# Patient Record
Sex: Female | Born: 1935 | Race: White | Hispanic: No | Smoking: Never smoker
Health system: Southern US, Community
[De-identification: ages and names within clinical notes are randomized; demographics above are authoritative.]

## PROBLEM LIST (undated history)

## (undated) DIAGNOSIS — I10 Essential (primary) hypertension: Secondary | ICD-10-CM

## (undated) DIAGNOSIS — K219 Gastro-esophageal reflux disease without esophagitis: Secondary | ICD-10-CM

## (undated) DIAGNOSIS — E079 Disorder of thyroid, unspecified: Secondary | ICD-10-CM

## (undated) DIAGNOSIS — M1990 Unspecified osteoarthritis, unspecified site: Secondary | ICD-10-CM

## (undated) DIAGNOSIS — Z8719 Personal history of other diseases of the digestive system: Secondary | ICD-10-CM

## (undated) HISTORY — PX: ABDOMINAL HYSTERECTOMY: SHX81

## (undated) HISTORY — PX: TONSILLECTOMY: SUR1361

## (undated) HISTORY — PX: APPENDECTOMY: SHX54

## (undated) HISTORY — PX: TUBAL LIGATION: SHX77

---

## 2004-09-26 ENCOUNTER — Ambulatory Visit: Payer: Self-pay | Admitting: Internal Medicine

## 2006-02-12 ENCOUNTER — Ambulatory Visit: Payer: Self-pay | Admitting: Internal Medicine

## 2007-02-11 ENCOUNTER — Ambulatory Visit: Payer: Self-pay | Admitting: Gastroenterology

## 2007-09-11 ENCOUNTER — Ambulatory Visit: Payer: Self-pay | Admitting: Family Medicine

## 2008-10-08 ENCOUNTER — Ambulatory Visit: Payer: Self-pay | Admitting: Family Medicine

## 2009-11-05 ENCOUNTER — Ambulatory Visit: Payer: Self-pay | Admitting: Family Medicine

## 2010-04-28 ENCOUNTER — Ambulatory Visit: Payer: Self-pay | Admitting: Gastroenterology

## 2010-04-29 LAB — PATHOLOGY REPORT

## 2010-11-09 ENCOUNTER — Ambulatory Visit: Payer: Self-pay | Admitting: Family Medicine

## 2012-01-31 ENCOUNTER — Ambulatory Visit: Payer: Self-pay | Admitting: Internal Medicine

## 2012-04-22 ENCOUNTER — Ambulatory Visit: Payer: Self-pay | Admitting: Gastroenterology

## 2013-02-05 ENCOUNTER — Ambulatory Visit: Payer: Self-pay | Admitting: Nurse Practitioner

## 2014-02-11 ENCOUNTER — Ambulatory Visit: Payer: Self-pay | Admitting: Nurse Practitioner

## 2015-03-15 ENCOUNTER — Encounter: Payer: Self-pay | Admitting: Emergency Medicine

## 2015-03-15 ENCOUNTER — Ambulatory Visit
Admission: EM | Admit: 2015-03-15 | Discharge: 2015-03-15 | Disposition: A | Discharge status: Home / Self Care | Payer: Medicare Other | Attending: Family Medicine | Admitting: Family Medicine

## 2015-03-15 DIAGNOSIS — L259 Unspecified contact dermatitis, unspecified cause: Secondary | ICD-10-CM | POA: Diagnosis not present

## 2015-03-15 HISTORY — DX: Disorder of thyroid, unspecified: E07.9

## 2015-03-15 MED ORDER — PREDNISONE 10 MG (21) PO TBPK: ORAL_TABLET | ORAL | Status: DC

## 2015-03-15 MED ORDER — RANITIDINE HCL 150 MG PO CAPS: 150.0000 mg | ORAL_CAPSULE | Freq: Two times a day (BID) | ORAL | Status: DC

## 2015-03-15 MED ORDER — LORATADINE 10 MG PO TABS: 10.0000 mg | ORAL_TABLET | Freq: Every day | ORAL | Status: DC

## 2015-03-15 NOTE — ED Provider Notes (Signed)
CSN: [id number]     Arrival date & time 03/15/15  1105 History   First MD Initiated Contact with Patient 03/15/15 1159     Chief Complaint  Patient presents with  . Rash   (Consider location/radiation/quality/duration/timing/severity/associated sxs/prior Treatment) Patient is a 81 y.o. female presenting with rash. The history is provided by the patient. No language interpreter was used.  Rash Location:  Head/neck and shoulder/arm Head/neck rash location:  L neck and R neck Shoulder/arm rash location:  L shoulder Quality: blistering and itchiness   Severity:  Moderate Onset quality:  Sudden Duration:  2 days Timing:  Constant Progression:  Spreading Chronicity:  New Context comment:  Unknown Relieved by:  Nothing Worsened by:  Nothing tried Ineffective treatments:  None tried Associated symptoms comment:  Rash   Past Medical History  Diagnosis Date  . Thyroid disease    Past Surgical History  Procedure Laterality Date  . Abdominal hysterectomy    . Tonsillectomy     History reviewed. No pertinent family history. Social History  Substance Use Topics  . Smoking status: Never Smoker   . Smokeless tobacco: None  . Alcohol Use: No   OB History    No data available     Review of Systems  Constitutional: Negative for activity change.  Skin: Positive for rash.  All other systems reviewed and are negative.   Allergies  Penicillins  Home Medications   Prior to Admission medications   Medication Sig Start Date End Date Taking? Authorizing Provider  enalapril (VASOTEC) 20 MG tablet Take 20 mg by mouth daily.   Yes Historical Provider, MD  levothyroxine (SYNTHROID, LEVOTHROID) 88 MCG tablet Take 88 mcg by mouth daily before breakfast.   Yes Historical Provider, MD  omeprazole (PRILOSEC) 20 MG capsule Take 20 mg by mouth daily.   Yes Historical Provider, MD  oxybutynin (DITROPAN) 5 MG tablet Take 5 mg by mouth 3 (three) times daily.   Yes Historical Provider, MD   loratadine (CLARITIN) 10 MG tablet Take 1 tablet (10 mg total) by mouth daily. Take 1 tablet in the morning. As needed for itching. 03/15/15   Hassan Rowan, MD  predniSONE (STERAPRED UNI-PAK 21 TAB) 10 MG (21) TBPK tablet 6 tabs day 1 and 2, 5 tabs day 3 and 4, 4 tabs day 5 and 6, 3 tabs day 7 and 8, 2 tabs day 9 and 10, 1 tab day 11 and 12. Take orally 03/15/15   Hassan Rowan, MD  ranitidine (ZANTAC) 150 MG capsule Take 1 capsule (150 mg total) by mouth 2 (two) times daily. As needed for itching 03/15/15   Hassan Rowan, MD   Meds Ordered and Administered this Visit  Medications - No data to display  BP 134/60 mmHg  Pulse 71  Temp(Src) 98.6 F (37 C) (Tympanic)  Resp 18  Ht  (1.6 m)  Wt 185 lb (83.915 kg)  BMI 32.78 kg/m2  SpO2 99% No data found.   Physical Exam  Constitutional: She is oriented to person, place, and time. She appears well-developed and well-nourished.  HENT:  Head: Normocephalic.  Eyes: Pupils are equal, round, and reactive to light.  Neck: Normal range of motion. Neck supple.  Musculoskeletal: Normal range of motion. She exhibits no edema or tenderness.  Neurological: She is alert and oriented to person, place, and time. No cranial nerve deficit.  Skin: Rash noted. There is erythema.  Psychiatric: She has a normal mood and affect. Her behavior is normal.  Vitals reviewed.  ED Course  Procedures (including critical care time)  Labs Review Labs Reviewed - No data to display  Imaging Review No results found.   Visual Acuity Review  Right Eye Distance:   Left Eye Distance:   Bilateral Distance:    Right Eye Near:   Left Eye Near:    Bilateral Near:         MDM   1. Contact dermatitis    Patient doesn't have a known contact but does have 2 cats ago outside and only She says that she touches is one that stays indoors. Rash got worse and appears to be contact dermatitis of unknown source of exposure. We'll place on prednisone for 12 days Zantac  and Claritin for itching.    Hassan Rowan, MD 03/15/15 [phone]

## 2015-03-15 NOTE — ED Notes (Signed)
Pt with a rash on face and chest x 3 days itching

## 2015-03-15 NOTE — Discharge Instructions (Signed)
Contact Dermatitis °Contact dermatitis is a rash that happens when something touches the skin. You touched something that irritates your skin, or you have allergies to something you touched. °HOME CARE  °· Avoid the thing that caused your rash. °· Keep your rash away from hot water, soap, sunlight, chemicals, and other things that might bother it. °· Do not scratch your rash. °· You can take cool baths to help stop itching. °· Only take medicine as told by your doctor. °· Keep all doctor visits as told. °GET HELP RIGHT AWAY IF:  °· Your rash is not better after 3 days. °· Your rash gets worse. °· Your rash is puffy (swollen), tender, red, sore, or warm. °· You have problems with your medicine. °MAKE SURE YOU:  °· Understand these instructions. °· Will watch your condition. °· Will get help right away if you are not doing well or get worse. °Document Released: 04/23/2009 Document Revised: 09/18/2011 Document Reviewed: 11/29/2010 °[employer]® Patient Information ©2015 [employer]. This information is not intended to replace advice given to you by your health care provider. Make sure you discuss any questions you have with your health care provider. ° °

## 2016-03-27 ENCOUNTER — Other Ambulatory Visit: Payer: Self-pay | Admitting: Nurse Practitioner

## 2016-03-27 DIAGNOSIS — Z1231 Encounter for screening mammogram for malignant neoplasm of breast: Secondary | ICD-10-CM

## 2016-03-28 ENCOUNTER — Ambulatory Visit: Payer: Medicare Other

## 2016-03-29 ENCOUNTER — Ambulatory Visit
Admission: RE | Admit: 2016-03-29 | Discharge: 2016-03-29 | Disposition: A | Discharge status: Home / Self Care | Payer: Medicare Other | Source: Ambulatory Visit | Attending: Nurse Practitioner | Admitting: Nurse Practitioner

## 2016-03-29 ENCOUNTER — Other Ambulatory Visit: Payer: Self-pay | Admitting: Nurse Practitioner

## 2016-03-29 DIAGNOSIS — Z1231 Encounter for screening mammogram for malignant neoplasm of breast: Secondary | ICD-10-CM

## 2017-07-13 ENCOUNTER — Ambulatory Visit
Admission: RE | Admit: 2017-07-13 | Discharge: 2017-07-13 | Disposition: A | Discharge status: Home / Self Care | Payer: Medicare Other | Source: Ambulatory Visit | Attending: Nurse Practitioner | Admitting: Nurse Practitioner

## 2017-07-13 ENCOUNTER — Other Ambulatory Visit: Payer: Self-pay | Admitting: Nurse Practitioner

## 2017-07-13 DIAGNOSIS — S32000A Wedge compression fracture of unspecified lumbar vertebra, initial encounter for closed fracture: Secondary | ICD-10-CM

## 2017-07-13 DIAGNOSIS — K8020 Calculus of gallbladder without cholecystitis without obstruction: Secondary | ICD-10-CM | POA: Insufficient documentation

## 2017-07-13 DIAGNOSIS — M48061 Spinal stenosis, lumbar region without neurogenic claudication: Secondary | ICD-10-CM | POA: Diagnosis not present

## 2017-07-13 DIAGNOSIS — K5730 Diverticulosis of large intestine without perforation or abscess without bleeding: Secondary | ICD-10-CM | POA: Insufficient documentation

## 2017-07-13 DIAGNOSIS — X58XXXA Exposure to other specified factors, initial encounter: Secondary | ICD-10-CM | POA: Insufficient documentation

## 2017-07-13 DIAGNOSIS — M4804 Spinal stenosis, thoracic region: Secondary | ICD-10-CM | POA: Diagnosis not present

## 2017-07-13 DIAGNOSIS — M5136 Other intervertebral disc degeneration, lumbar region: Secondary | ICD-10-CM | POA: Diagnosis not present

## 2017-07-13 DIAGNOSIS — S22080A Wedge compression fracture of T11-T12 vertebra, initial encounter for closed fracture: Secondary | ICD-10-CM | POA: Diagnosis not present

## 2017-07-16 ENCOUNTER — Other Ambulatory Visit: Payer: Self-pay | Admitting: Nurse Practitioner

## 2017-07-25 ENCOUNTER — Encounter
Admission: RE | Admit: 2017-07-25 | Discharge: 2017-07-25 | Disposition: A | Discharge status: Home / Self Care | Payer: Medicare Other | Source: Ambulatory Visit | Attending: Orthopedic Surgery | Admitting: Orthopedic Surgery

## 2017-07-25 ENCOUNTER — Other Ambulatory Visit: Payer: Self-pay

## 2017-07-25 DIAGNOSIS — E039 Hypothyroidism, unspecified: Secondary | ICD-10-CM | POA: Diagnosis not present

## 2017-07-25 DIAGNOSIS — K219 Gastro-esophageal reflux disease without esophagitis: Secondary | ICD-10-CM | POA: Diagnosis not present

## 2017-07-25 DIAGNOSIS — N183 Chronic kidney disease, stage 3 (moderate): Secondary | ICD-10-CM | POA: Diagnosis not present

## 2017-07-25 DIAGNOSIS — Z888 Allergy status to other drugs, medicaments and biological substances status: Secondary | ICD-10-CM | POA: Diagnosis not present

## 2017-07-25 DIAGNOSIS — M9963 Osseous and subluxation stenosis of intervertebral foramina of lumbar region: Secondary | ICD-10-CM | POA: Diagnosis not present

## 2017-07-25 DIAGNOSIS — I10 Essential (primary) hypertension: Secondary | ICD-10-CM

## 2017-07-25 DIAGNOSIS — Z79899 Other long term (current) drug therapy: Secondary | ICD-10-CM | POA: Diagnosis not present

## 2017-07-25 DIAGNOSIS — Z7982 Long term (current) use of aspirin: Secondary | ICD-10-CM | POA: Diagnosis not present

## 2017-07-25 DIAGNOSIS — Z880 Allergy status to penicillin: Secondary | ICD-10-CM | POA: Diagnosis not present

## 2017-07-25 DIAGNOSIS — M810 Age-related osteoporosis without current pathological fracture: Secondary | ICD-10-CM | POA: Diagnosis not present

## 2017-07-25 DIAGNOSIS — M8448XA Pathological fracture, other site, initial encounter for fracture: Secondary | ICD-10-CM | POA: Diagnosis not present

## 2017-07-25 DIAGNOSIS — Z86010 Personal history of colonic polyps: Secondary | ICD-10-CM | POA: Diagnosis not present

## 2017-07-25 DIAGNOSIS — I129 Hypertensive chronic kidney disease with stage 1 through stage 4 chronic kidney disease, or unspecified chronic kidney disease: Secondary | ICD-10-CM | POA: Diagnosis not present

## 2017-07-25 DIAGNOSIS — E785 Hyperlipidemia, unspecified: Secondary | ICD-10-CM | POA: Diagnosis not present

## 2017-07-25 DIAGNOSIS — M1990 Unspecified osteoarthritis, unspecified site: Secondary | ICD-10-CM | POA: Diagnosis not present

## 2017-07-25 HISTORY — DX: Unspecified osteoarthritis, unspecified site: M19.90

## 2017-07-25 HISTORY — DX: Essential (primary) hypertension: I10

## 2017-07-25 NOTE — Patient Instructions (Signed)
Your procedure is scheduled on: July 26, 2017 AT 10:00 AM Report to Same Day Surgery on the 2nd floor in the [facility].   REMEMBER: Instructions that are not followed completely may result in serious medical risk, up to and including death; or upon the discretion of your surgeon and anesthesiologist your surgery may need to be rescheduled.  Do not eat food after midnight the night before your procedure.  No gum chewing or hard candies.  You may however, drink CLEAR liquids up to 2 hours before you are scheduled to arrive at the hospital for your procedure.  Do not drink clear liquids within 2 hours of the start of your surgery.  Clear liquids include: - water  - apple juice without pulp - clear gatorade - black coffee or tea (Do NOT add anything to the coffee or tea) Do NOT drink anything that is not on this list.  No Alcohol for 24 hours before or after surgery.  No Smoking including e-cigarettes for 24 hours prior to surgery. No chewable tobacco products for at least 6 hours prior to surgery. No nicotine patches on the day of surgery.  Notify your doctor if there is any change in your medical condition (cold, fever, infection).  Do not wear jewelry, make-up, hairpins, clips or nail polish.  Do not wear lotions, powders, or perfumes. You may NOT wear deodorant.  Do not shave 48 hours prior to surgery. Men may shave face and neck.  Contacts and dentures may not be worn into surgery.  Do not bring valuables to the hospital. [facility] is not responsible for any belongings or valuables.   TAKE THESE MEDICATIONS THE MORNING OF SURGERY WITH A SIP OF WATER: PAIN PILL IF NEEDED  LEVOTHYROXINE OMEPRAZOLE - TAKE A DOSE NIGHT BEFORE SURGERY AND THE MORNING OF SURGERY OXYBUTYNIN TYLENOL IF NEEDED  Use CHG Soap or wipes as directed on instruction sheet.   Follow recommendations from Cardiologist, Pulmonologist or PCP regarding stopping Aspirin, Coumadin, Plavix, Eliquis,  Pradaxa, or Pletal.  Stop Anti-inflammatories such as Advil, Aleve, Ibuprofen, Motrin, Naproxen, Naprosyn, Goodie powder, or aspirin products. (May take Tylenol or Acetaminophen if needed.)  Stop ANY OVER THE COUNTER supplements until after surgery. (May continue Vitamin D, Vitamin B, and multivitamin.)   If you are being discharged the day of surgery, you will not be allowed to drive home. You will need someone to drive you home and stay with you that night.   If you are taking public transportation, you will need to have a responsible adult to with you.  Please call the number above if you have any questions about these instructions.

## 2017-07-26 ENCOUNTER — Encounter
Admission: RE | Disposition: A | Discharge status: Home / Self Care | Payer: Self-pay | Source: Ambulatory Visit | Attending: Orthopedic Surgery

## 2017-07-26 ENCOUNTER — Ambulatory Visit: Payer: Medicare Other | Admitting: Anesthesiology

## 2017-07-26 ENCOUNTER — Ambulatory Visit
Admission: RE | Admit: 2017-07-26 | Discharge: 2017-07-26 | Disposition: A | Discharge status: Home / Self Care | Payer: Medicare Other | Source: Ambulatory Visit | Attending: Orthopedic Surgery | Admitting: Orthopedic Surgery

## 2017-07-26 ENCOUNTER — Ambulatory Visit: Payer: Medicare Other

## 2017-07-26 DIAGNOSIS — E039 Hypothyroidism, unspecified: Secondary | ICD-10-CM | POA: Insufficient documentation

## 2017-07-26 DIAGNOSIS — I129 Hypertensive chronic kidney disease with stage 1 through stage 4 chronic kidney disease, or unspecified chronic kidney disease: Secondary | ICD-10-CM | POA: Insufficient documentation

## 2017-07-26 DIAGNOSIS — N183 Chronic kidney disease, stage 3 (moderate): Secondary | ICD-10-CM | POA: Insufficient documentation

## 2017-07-26 DIAGNOSIS — E785 Hyperlipidemia, unspecified: Secondary | ICD-10-CM | POA: Insufficient documentation

## 2017-07-26 DIAGNOSIS — M9963 Osseous and subluxation stenosis of intervertebral foramina of lumbar region: Secondary | ICD-10-CM | POA: Insufficient documentation

## 2017-07-26 DIAGNOSIS — Z7982 Long term (current) use of aspirin: Secondary | ICD-10-CM | POA: Insufficient documentation

## 2017-07-26 DIAGNOSIS — Z86010 Personal history of colonic polyps: Secondary | ICD-10-CM | POA: Insufficient documentation

## 2017-07-26 DIAGNOSIS — Z79899 Other long term (current) drug therapy: Secondary | ICD-10-CM | POA: Insufficient documentation

## 2017-07-26 DIAGNOSIS — Z888 Allergy status to other drugs, medicaments and biological substances status: Secondary | ICD-10-CM | POA: Insufficient documentation

## 2017-07-26 DIAGNOSIS — M810 Age-related osteoporosis without current pathological fracture: Secondary | ICD-10-CM | POA: Insufficient documentation

## 2017-07-26 DIAGNOSIS — Z880 Allergy status to penicillin: Secondary | ICD-10-CM | POA: Insufficient documentation

## 2017-07-26 DIAGNOSIS — Z419 Encounter for procedure for purposes other than remedying health state, unspecified: Secondary | ICD-10-CM

## 2017-07-26 DIAGNOSIS — M8448XA Pathological fracture, other site, initial encounter for fracture: Secondary | ICD-10-CM | POA: Insufficient documentation

## 2017-07-26 DIAGNOSIS — K219 Gastro-esophageal reflux disease without esophagitis: Secondary | ICD-10-CM | POA: Insufficient documentation

## 2017-07-26 DIAGNOSIS — M1990 Unspecified osteoarthritis, unspecified site: Secondary | ICD-10-CM | POA: Insufficient documentation

## 2017-07-26 HISTORY — PX: VERTEBROPLASTY: SHX113

## 2017-07-26 SURGERY — VERTEBROPLASTY
Anesthesia: General | Wound class: Clean

## 2017-07-26 MED ORDER — PROPOFOL 10 MG/ML IV BOLUS
INTRAVENOUS | Status: AC
  Filled 2017-07-26: qty 20

## 2017-07-26 MED ORDER — LACTATED RINGERS IV SOLN
INTRAVENOUS | Status: DC
  Administered 2017-07-26: 10:00:00 via INTRAVENOUS

## 2017-07-26 MED ORDER — LIDOCAINE HCL (PF) 1 % IJ SOLN
INTRAMUSCULAR | Status: DC | PRN
  Administered 2017-07-26: 20 mL

## 2017-07-26 MED ORDER — CLINDAMYCIN PHOSPHATE 900 MG/50ML IV SOLN
900.0000 mg | Freq: Once | INTRAVENOUS | Status: AC
  Administered 2017-07-26: 900 mg via INTRAVENOUS

## 2017-07-26 MED ORDER — BUPIVACAINE-EPINEPHRINE (PF) 0.5% -1:200000 IJ SOLN
INTRAMUSCULAR | Status: DC | PRN
  Administered 2017-07-26: 20 mL

## 2017-07-26 MED ORDER — BUPIVACAINE-EPINEPHRINE (PF) 0.5% -1:200000 IJ SOLN
INTRAMUSCULAR | Status: AC
  Filled 2017-07-26: qty 30

## 2017-07-26 MED ORDER — FENTANYL CITRATE (PF) 100 MCG/2ML IJ SOLN
25.0000 ug | INTRAMUSCULAR | Status: DC | PRN
Start: 2017-07-26 — End: 2017-07-26

## 2017-07-26 MED ORDER — PROPOFOL 500 MG/50ML IV EMUL
INTRAVENOUS | Status: DC | PRN
  Administered 2017-07-26: 25 ug/kg/min via INTRAVENOUS

## 2017-07-26 MED ORDER — CLINDAMYCIN PHOSPHATE 900 MG/50ML IV SOLN
INTRAVENOUS | Status: AC
  Filled 2017-07-26: qty 50

## 2017-07-26 MED ORDER — LIDOCAINE HCL (PF) 1 % IJ SOLN
INTRAMUSCULAR | Status: AC
  Filled 2017-07-26: qty 30

## 2017-07-26 MED ORDER — PROPOFOL 10 MG/ML IV BOLUS
INTRAVENOUS | Status: DC | PRN
  Administered 2017-07-26 (×2): 20 mg via INTRAVENOUS

## 2017-07-26 MED ORDER — HYDROCODONE-ACETAMINOPHEN 5-325 MG PO TABS: 1.0000 | ORAL_TABLET | Freq: Four times a day (QID) | ORAL | 0 refills | Status: DC | PRN

## 2017-07-26 MED ORDER — ONDANSETRON HCL 4 MG/2ML IJ SOLN: 4.0000 mg | Freq: Once | INTRAMUSCULAR | Status: DC | PRN

## 2017-07-26 MED ORDER — KETAMINE HCL 50 MG/ML IJ SOLN
INTRAMUSCULAR | Status: DC | PRN
  Administered 2017-07-26: 10 mg via INTRAVENOUS
  Administered 2017-07-26: 15 mg via INTRAVENOUS

## 2017-07-26 MED ORDER — HYDROCODONE-ACETAMINOPHEN 5-325 MG PO TABS
ORAL_TABLET | ORAL | Status: AC
  Filled 2017-07-26: qty 1

## 2017-07-26 MED ORDER — HYDROCODONE-ACETAMINOPHEN 5-325 MG PO TABS
1.0000 | ORAL_TABLET | Freq: Four times a day (QID) | ORAL | Status: DC | PRN
  Administered 2017-07-26: 1 via ORAL

## 2017-07-26 MED ORDER — LIDOCAINE HCL (PF) 2 % IJ SOLN
INTRAMUSCULAR | Status: AC
  Filled 2017-07-26: qty 10

## 2017-07-26 MED ORDER — LIDOCAINE 2% (20 MG/ML) 5 ML SYRINGE
INTRAMUSCULAR | Status: DC | PRN
  Administered 2017-07-26: 50 mg via INTRAVENOUS

## 2017-07-26 SURGICAL SUPPLY — 20 items
ADH SKN CLS APL DERMABOND .7 (GAUZE/BANDAGES/DRESSINGS) ×1
BIT DRILL KYPHON EXPRESS (MISCELLANEOUS) ×2 IMPLANT
CEMENT BONE KYPHON CDS (Cement) ×3 IMPLANT
CEMENT KYPHON CX01A KIT/MIXER (Cement) ×3 IMPLANT
DERMABOND ADVANCED (GAUZE/BANDAGES/DRESSINGS) ×2
DERMABOND ADVANCED .7 DNX12 (GAUZE/BANDAGES/DRESSINGS) ×1 IMPLANT
DEVICE BIOPSY BONE KYPH (INSTRUMENTS) ×18 IMPLANT
DRAPE C-ARM XRAY 36X54 (DRAPES) ×3 IMPLANT
DRILL KYPHON EXPRESS (MISCELLANEOUS) ×6
DURAPREP 26ML APPLICATOR (WOUND CARE) ×3 IMPLANT
GLOVE BIOGEL PI IND STRL 7.5 (GLOVE) ×2 IMPLANT
GLOVE BIOGEL PI INDICATOR 7.5 (GLOVE) ×4
GLOVE SURG SYN 9.0  PF PI (GLOVE) ×2
GLOVE SURG SYN 9.0 PF PI (GLOVE) ×1 IMPLANT
GOWN SRG 2XL LVL 4 RGLN SLV (GOWNS) ×1 IMPLANT
GOWN STRL NON-REIN 2XL LVL4 (GOWNS) ×3
GOWN STRL REUS W/ TWL LRG LVL3 (GOWN DISPOSABLE) ×2 IMPLANT
GOWN STRL REUS W/TWL LRG LVL3 (GOWN DISPOSABLE) ×6
KIT RM TURNOVER STRD PROC AR (KITS) ×3 IMPLANT
PACK KYPHOPLASTY (MISCELLANEOUS) ×3 IMPLANT

## 2017-07-26 NOTE — Anesthesia Postprocedure Evaluation (Signed)
Anesthesia Post Note  Patient: Traci Lee  Procedure(s) Performed: VERTEBROPLASTY/ SACROPLASTY S1 (N/A )  Patient location during evaluation: PACU Anesthesia Type: General Level of consciousness: awake and alert and oriented Pain management: pain level controlled Vital Signs Assessment: post-procedure vital signs reviewed and stable Respiratory status: spontaneous breathing Cardiovascular status: blood pressure returned to baseline Anesthetic complications: no     Last Vitals:  Vitals:   07/26/17 1416 07/26/17 1443  BP: (!) 147/52 (!) 122/50  Pulse: 94 92  Resp: 18   Temp: (!) 36.2 C   SpO2: 100% 99%    Last Pain:  Vitals:   07/26/17 1416  TempSrc: Temporal  PainSc: 0-No pain                 Holt Woolbright

## 2017-07-26 NOTE — Op Note (Signed)
07/26/2017  1:32 PM  PATIENT:  Traci Lee  84 y.o. female  PRE-OPERATIVE DIAGNOSIS:  Sacral insufficiency fracture  POST-OPERATIVE DIAGNOSIS: Sacral insufficiency fracture  PROCEDURE:  Procedure(s): VERTEBROPLASTY/ SACROPLASTY S1 (N/A)  SURGEON: Laurene Footman, MD  ASSISTANTS: None  ANESTHESIA:   local and MAC  EBL:  No intake/output data recorded.  BLOOD ADMINISTERED:none  DRAINS: none   LOCAL MEDICATIONS USED:  MARCAINE    and XYLOCAINE   SPECIMEN:  No Specimen  DISPOSITION OF SPECIMEN:  N/A  COUNTS:  YES  TOURNIQUET:  * No tourniquets in log *  IMPLANTS: Bone cement  DICTATION: .Dragon Dictation patient brought the operating room and after adequate sedation was given the patient was placed prone and the C-arm was brought in and good visualization obtained of the sacrum. After appropriate patient identification timeout procedure local asthenic was infiltrated subcutaneously. Back was then prepped and draped in sterile fashion and repeat timeout procedure carried out. Spinal needle was used to get local down to the S1 pedicle on both sides. A small incision was made and trochars advanced just lateral to the S1 pedicle on each side getting into the sacrum but staying out of the SI joint. Care was taken to get x-rays on both AP and lateral images to be certain of placement trocar. Biopsy was attempted but not obtained. 3 cc of bone cement was injected into these areas and after the cement had set trochars removed and permanent C-arm views obtained Dermabond was used to close the skin followed by a Band-Aid  PLAN OF CARE: Discharge to home after PACU  PATIENT DISPOSITION:  PACU - hemodynamically stable.

## 2017-07-26 NOTE — Discharge Instructions (Addendum)
Pain medicine as directed. Take it easy today and tomorrow and resume more normal activities on Saturday. Remove Band-Aid on Saturday and then okay to shower.  AMBULATORY SURGERY  DISCHARGE INSTRUCTIONS   1) The drugs that you were given will stay in your system until tomorrow so for the next 24 hours you should not:  A) Drive an automobile B) Make any legal decisions C) Drink any alcoholic beverage   2) You may resume regular meals tomorrow.  Today it is better to start with liquids and gradually work up to solid foods.  You may eat anything you prefer, but it is better to start with liquids, then soup and crackers, and gradually work up to solid foods.   3) Please notify your doctor immediately if you have any unusual bleeding, trouble breathing, redness and pain at the surgery site, drainage, fever, or pain not relieved by medication.    4) Additional Instructions:        Please contact your physician with any problems or Same Day Surgery at [phone], Monday through Friday 6 am to 4 pm, or [place] at [facility] Main number at [phone].

## 2017-07-26 NOTE — H&P (Signed)
Reviewed paper H+P, will be scanned into chart. No changes noted. Patient was seen in neurosurgery in our office after I initially saw her for her T12 fracture and they noted her sacral insufficiency fracture that radiology head-tilt to appreciate. Because of her pain being in this lower region and this fit her symptoms we have elected to proceed with sacral plasty

## 2017-07-26 NOTE — Anesthesia Post-op Follow-up Note (Signed)
Anesthesia QCDR form completed.        

## 2017-07-26 NOTE — Transfer of Care (Signed)
Immediate Anesthesia Transfer of Care Note  Patient: Traci Lee  Procedure(s) Performed: VERTEBROPLASTY/ SACROPLASTY S1 (N/A )  Patient Location: PACU  Anesthesia Type:General  Level of Consciousness: awake, alert  and oriented  Airway & Oxygen Therapy: Patient Spontanous Breathing  Post-op Assessment: Report given to RN and Post -op Vital signs reviewed and stable  Post vital signs: Reviewed  Last Vitals:  Vitals:   07/26/17 1000 07/26/17 1335  BP: 120/72 115/65  Pulse: 87 92  Resp: 18 17  Temp: (!) 35.9 C   SpO2: 94% 98%    Last Pain:  Vitals:   07/26/17 1000  PainSc: 5          Complications: No apparent anesthesia complications

## 2017-07-26 NOTE — Anesthesia Preprocedure Evaluation (Signed)
Anesthesia Evaluation  Patient identified by MRN, date of birth, ID band Patient awake    Reviewed: Allergy & Precautions, NPO status , Patient's Chart, lab work & pertinent test results  Airway Mallampati: II  TM Distance: <3 FB     Dental   Pulmonary neg pulmonary ROS,    Pulmonary exam normal        Cardiovascular hypertension, Pt. on medications Normal cardiovascular exam     Neuro/Psych negative neurological ROS  negative psych ROS   GI/Hepatic Neg liver ROS, GERD  Medicated and Controlled,  Endo/Other  Hypothyroidism   Renal/GU negative Renal ROS  negative genitourinary   Musculoskeletal  (+) Arthritis , Osteoarthritis,    Abdominal Normal abdominal exam  (+)   Peds negative pediatric ROS (+)  Hematology negative hematology ROS (+)   Anesthesia Other Findings   Reproductive/Obstetrics                             Anesthesia Physical Anesthesia Plan  ASA: III  Anesthesia Plan: General and MAC   Post-op Pain Management:    Induction: Intravenous  PONV Risk Score and Plan: TIVA  Airway Management Planned: Nasal Cannula  Additional Equipment:   Intra-op Plan:   Post-operative Plan:   Informed Consent: I have reviewed the patients History and Physical, chart, labs and discussed the procedure including the risks, benefits and alternatives for the proposed anesthesia with the patient or authorized representative who has indicated his/her understanding and acceptance.   Dental advisory given  Plan Discussed with: CRNA and Surgeon  Anesthesia Plan Comments:         Anesthesia Quick Evaluation

## 2017-08-14 ENCOUNTER — Emergency Department: Payer: Medicare Other

## 2017-08-14 ENCOUNTER — Other Ambulatory Visit: Payer: Self-pay

## 2017-08-14 ENCOUNTER — Inpatient Hospital Stay: Payer: Medicare Other | Admitting: Anesthesiology

## 2017-08-14 ENCOUNTER — Encounter
Admission: EM | Disposition: A | Discharge status: Home Health | Payer: Self-pay | Source: Home / Self Care | Attending: Internal Medicine

## 2017-08-14 ENCOUNTER — Encounter: Payer: Self-pay | Admitting: Emergency Medicine

## 2017-08-14 ENCOUNTER — Inpatient Hospital Stay
Admission: EM | Admit: 2017-08-14 | Discharge: 2017-08-15 | DRG: 517 | Disposition: A | Discharge status: Home Health | Payer: Medicare Other | Attending: Internal Medicine | Admitting: Internal Medicine

## 2017-08-14 ENCOUNTER — Inpatient Hospital Stay: Payer: Medicare Other

## 2017-08-14 DIAGNOSIS — I10 Essential (primary) hypertension: Secondary | ICD-10-CM | POA: Diagnosis not present

## 2017-08-14 DIAGNOSIS — Z419 Encounter for procedure for purposes other than remedying health state, unspecified: Secondary | ICD-10-CM

## 2017-08-14 DIAGNOSIS — Z79899 Other long term (current) drug therapy: Secondary | ICD-10-CM

## 2017-08-14 DIAGNOSIS — Z880 Allergy status to penicillin: Secondary | ICD-10-CM

## 2017-08-14 DIAGNOSIS — Z7982 Long term (current) use of aspirin: Secondary | ICD-10-CM

## 2017-08-14 DIAGNOSIS — M545 Low back pain, unspecified: Secondary | ICD-10-CM | POA: Diagnosis present

## 2017-08-14 DIAGNOSIS — M4854XA Collapsed vertebra, not elsewhere classified, thoracic region, initial encounter for fracture: Principal | ICD-10-CM | POA: Diagnosis present

## 2017-08-14 DIAGNOSIS — Z888 Allergy status to other drugs, medicaments and biological substances status: Secondary | ICD-10-CM | POA: Diagnosis not present

## 2017-08-14 DIAGNOSIS — S22000A Wedge compression fracture of unspecified thoracic vertebra, initial encounter for closed fracture: Secondary | ICD-10-CM

## 2017-08-14 HISTORY — PX: KYPHOPLASTY: SHX5884

## 2017-08-14 LAB — COMPREHENSIVE METABOLIC PANEL
ALBUMIN: 3.8 g/dL (ref 3.5–5.0)
ALK PHOS: 81 U/L (ref 38–126)
ALT: 16 U/L (ref 14–54)
ANION GAP: 13 (ref 5–15)
AST: 30 U/L (ref 15–41)
BILIRUBIN TOTAL: 0.8 mg/dL (ref 0.3–1.2)
BUN: 18 mg/dL (ref 6–20)
CALCIUM: 9.3 mg/dL (ref 8.9–10.3)
CO2: 23 mmol/L (ref 22–32)
Chloride: 97 mmol/L — ABNORMAL LOW (ref 101–111)
Creatinine, Ser: 0.99 mg/dL (ref 0.44–1.00)
GFR calc non Af Amer: 52 mL/min — ABNORMAL LOW (ref >60)
GLUCOSE: 130 mg/dL — AB (ref 65–99)
POTASSIUM: 4.4 mmol/L (ref 3.5–5.1)
SODIUM: 133 mmol/L — AB (ref 135–145)
TOTAL PROTEIN: 7 g/dL (ref 6.5–8.1)

## 2017-08-14 LAB — CBC
HEMATOCRIT: 43.8 % (ref 35.0–47.0)
Hemoglobin: 15 g/dL (ref 12.0–16.0)
MCH: 31.3 pg (ref 26.0–34.0)
MCHC: 34.2 g/dL (ref 32.0–36.0)
MCV: 91.6 fL (ref 80.0–100.0)
Platelets: 419 10*3/uL (ref 150–440)
RBC: 4.78 MIL/uL (ref 3.80–5.20)
RDW: 15.2 % — AB (ref 11.5–14.5)
WBC: 6.2 10*3/uL (ref 3.6–11.0)

## 2017-08-14 SURGERY — KYPHOPLASTY
Anesthesia: Monitor Anesthesia Care

## 2017-08-14 MED ORDER — KETAMINE HCL 50 MG/ML IJ SOLN
INTRAMUSCULAR | Status: DC | PRN
  Administered 2017-08-14 (×2): 25 mg via INTRAMUSCULAR

## 2017-08-14 MED ORDER — HYDRALAZINE HCL 20 MG/ML IJ SOLN: 10.0000 mg | Freq: Four times a day (QID) | INTRAMUSCULAR | Status: DC | PRN

## 2017-08-14 MED ORDER — PROPOFOL 10 MG/ML IV BOLUS
INTRAVENOUS | Status: AC
  Filled 2017-08-14: qty 20

## 2017-08-14 MED ORDER — OXYBUTYNIN CHLORIDE 5 MG PO TABS
5.0000 mg | ORAL_TABLET | Freq: Two times a day (BID) | ORAL | Status: DC
  Administered 2017-08-14 – 2017-08-15 (×2): 5 mg via ORAL
  Filled 2017-08-14 (×3): qty 1

## 2017-08-14 MED ORDER — PANTOPRAZOLE SODIUM 40 MG PO TBEC
40.0000 mg | DELAYED_RELEASE_TABLET | Freq: Every day | ORAL | Status: DC
  Administered 2017-08-14 – 2017-08-15 (×2): 40 mg via ORAL
  Filled 2017-08-14 (×2): qty 1

## 2017-08-14 MED ORDER — ALBUTEROL SULFATE (2.5 MG/3ML) 0.083% IN NEBU: 2.5000 mg | INHALATION_SOLUTION | RESPIRATORY_TRACT | Status: DC | PRN

## 2017-08-14 MED ORDER — LIDOCAINE HCL (PF) 1 % IJ SOLN
INTRAMUSCULAR | Status: AC
  Filled 2017-08-14: qty 30

## 2017-08-14 MED ORDER — LIDOCAINE HCL 1 % IJ SOLN
INTRAMUSCULAR | Status: DC | PRN
  Administered 2017-08-14: 20 mL

## 2017-08-14 MED ORDER — IOPAMIDOL (ISOVUE-M 200) INJECTION 41%
INTRAMUSCULAR | Status: DC | PRN
Start: 2017-08-14 — End: 2017-08-14
  Administered 2017-08-14: 20 mL

## 2017-08-14 MED ORDER — FENTANYL CITRATE (PF) 100 MCG/2ML IJ SOLN
25.0000 ug | Freq: Once | INTRAMUSCULAR | Status: AC
  Administered 2017-08-14: 25 ug via INTRAVENOUS

## 2017-08-14 MED ORDER — BUPIVACAINE-EPINEPHRINE (PF) 0.5% -1:200000 IJ SOLN
INTRAMUSCULAR | Status: AC
Start: 2017-08-14 — End: ?
  Filled 2017-08-14: qty 30

## 2017-08-14 MED ORDER — POLYETHYLENE GLYCOL 3350 17 G PO PACK: 17.0000 g | PACK | Freq: Every day | ORAL | Status: DC | PRN

## 2017-08-14 MED ORDER — ONDANSETRON HCL 4 MG/2ML IJ SOLN: 4.0000 mg | Freq: Once | INTRAMUSCULAR | Status: DC | PRN

## 2017-08-14 MED ORDER — ACETAMINOPHEN 650 MG RE SUPP: 650.0000 mg | Freq: Four times a day (QID) | RECTAL | Status: DC | PRN

## 2017-08-14 MED ORDER — SODIUM CHLORIDE 0.9 % IV SOLN: INTRAVENOUS | Status: DC

## 2017-08-14 MED ORDER — FENTANYL CITRATE (PF) 100 MCG/2ML IJ SOLN
25.0000 ug | Freq: Once | INTRAMUSCULAR | Status: AC
  Administered 2017-08-14: 25 ug via INTRAVENOUS
  Filled 2017-08-14: qty 2

## 2017-08-14 MED ORDER — ONDANSETRON HCL 4 MG PO TABS: 4.0000 mg | ORAL_TABLET | Freq: Four times a day (QID) | ORAL | Status: DC | PRN

## 2017-08-14 MED ORDER — PROPOFOL 10 MG/ML IV BOLUS
INTRAVENOUS | Status: DC | PRN
  Administered 2017-08-14 (×3): 20 mg via INTRAVENOUS

## 2017-08-14 MED ORDER — ENALAPRIL MALEATE 10 MG PO TABS
20.0000 mg | ORAL_TABLET | Freq: Every day | ORAL | Status: DC
  Administered 2017-08-15: 20 mg via ORAL
  Filled 2017-08-14 (×2): qty 2

## 2017-08-14 MED ORDER — ONDANSETRON HCL 4 MG/2ML IJ SOLN: 4.0000 mg | Freq: Four times a day (QID) | INTRAMUSCULAR | Status: DC | PRN

## 2017-08-14 MED ORDER — MORPHINE SULFATE (PF) 4 MG/ML IV SOLN
INTRAVENOUS | Status: AC
  Administered 2017-08-14: 4 mg via INTRAVENOUS
  Filled 2017-08-14: qty 1

## 2017-08-14 MED ORDER — MORPHINE SULFATE (PF) 4 MG/ML IV SOLN: 4.0000 mg | INTRAVENOUS | Status: DC | PRN

## 2017-08-14 MED ORDER — SODIUM CHLORIDE 0.9 % IV SOLN
INTRAVENOUS | Status: DC
  Administered 2017-08-14 – 2017-08-15 (×2): via INTRAVENOUS

## 2017-08-14 MED ORDER — LEVOTHYROXINE SODIUM 88 MCG PO TABS
88.0000 ug | ORAL_TABLET | Freq: Every day | ORAL | Status: DC
  Administered 2017-08-15: 88 ug via ORAL
  Filled 2017-08-14 (×2): qty 1

## 2017-08-14 MED ORDER — MORPHINE SULFATE (PF) 4 MG/ML IV SOLN
4.0000 mg | Freq: Once | INTRAVENOUS | Status: AC
  Administered 2017-08-14: 4 mg via INTRAVENOUS

## 2017-08-14 MED ORDER — FENTANYL CITRATE (PF) 100 MCG/2ML IJ SOLN: 25.0000 ug | INTRAMUSCULAR | Status: DC | PRN

## 2017-08-14 MED ORDER — SODIUM CHLORIDE FLUSH 0.9 % IV SOLN
INTRAVENOUS | Status: AC
  Filled 2017-08-14: qty 10

## 2017-08-14 MED ORDER — KETAMINE HCL 50 MG/ML IJ SOLN
INTRAMUSCULAR | Status: AC
  Filled 2017-08-14: qty 10

## 2017-08-14 MED ORDER — ENOXAPARIN SODIUM 40 MG/0.4ML ~~LOC~~ SOLN
40.0000 mg | SUBCUTANEOUS | Status: DC
  Administered 2017-08-15: 40 mg via SUBCUTANEOUS
  Filled 2017-08-14: qty 0.4

## 2017-08-14 MED ORDER — ACETAMINOPHEN 325 MG PO TABS
650.0000 mg | ORAL_TABLET | Freq: Four times a day (QID) | ORAL | Status: DC | PRN
  Administered 2017-08-15: 650 mg via ORAL
  Filled 2017-08-14: qty 2

## 2017-08-14 MED ORDER — OXYCODONE HCL 5 MG PO TABS
5.0000 mg | ORAL_TABLET | ORAL | Status: DC | PRN
  Administered 2017-08-14 – 2017-08-15 (×3): 5 mg via ORAL
  Filled 2017-08-14 (×3): qty 1

## 2017-08-14 MED ORDER — BUPIVACAINE-EPINEPHRINE (PF) 0.5% -1:200000 IJ SOLN
INTRAMUSCULAR | Status: DC | PRN
Start: 2017-08-14 — End: 2017-08-14
  Administered 2017-08-14: 10 mL via PERINEURAL

## 2017-08-14 MED ORDER — IOPAMIDOL (ISOVUE-M 200) INJECTION 41%
INTRAMUSCULAR | Status: AC
  Filled 2017-08-14: qty 20

## 2017-08-14 MED ORDER — BUPIVACAINE HCL (PF) 0.5 % IJ SOLN
INTRAMUSCULAR | Status: AC
  Filled 2017-08-14: qty 30

## 2017-08-14 MED ORDER — CLINDAMYCIN PHOSPHATE 600 MG/50ML IV SOLN
600.0000 mg | Freq: Once | INTRAVENOUS | Status: AC
  Administered 2017-08-14: 600 mg via INTRAVENOUS
  Filled 2017-08-14: qty 50

## 2017-08-14 MED ORDER — POLYVINYL ALCOHOL 1.4 % OP SOLN
1.0000 [drp] | Freq: Two times a day (BID) | OPHTHALMIC | Status: DC | PRN
  Filled 2017-08-14: qty 15

## 2017-08-14 MED ORDER — LACTATED RINGERS IV SOLN
INTRAVENOUS | Status: DC | PRN
  Administered 2017-08-14: 13:00:00 via INTRAVENOUS

## 2017-08-14 SURGICAL SUPPLY — 18 items
ADH SKN CLS APL DERMABOND .7 (GAUZE/BANDAGES/DRESSINGS) ×1
CEMENT KYPHON CX01A KIT/MIXER (Cement) ×3 IMPLANT
DERMABOND ADVANCED (GAUZE/BANDAGES/DRESSINGS) ×2
DERMABOND ADVANCED .7 DNX12 (GAUZE/BANDAGES/DRESSINGS) ×1 IMPLANT
DEVICE BIOPSY BONE KYPHX (INSTRUMENTS) ×3 IMPLANT
DRAPE C-ARM XRAY 36X54 (DRAPES) ×3 IMPLANT
DURAPREP 26ML APPLICATOR (WOUND CARE) ×3 IMPLANT
GLOVE SURG SYN 9.0  PF PI (GLOVE) ×2
GLOVE SURG SYN 9.0 PF PI (GLOVE) ×1 IMPLANT
GOWN SRG 2XL LVL 4 RGLN SLV (GOWNS) ×1 IMPLANT
GOWN STRL NON-REIN 2XL LVL4 (GOWNS) ×3
GOWN STRL REUS W/ TWL LRG LVL3 (GOWN DISPOSABLE) ×1 IMPLANT
GOWN STRL REUS W/TWL LRG LVL3 (GOWN DISPOSABLE) ×3
PACK KYPHOPLASTY (MISCELLANEOUS) ×3 IMPLANT
STRAP SAFETY 5IN WIDE (MISCELLANEOUS) ×3 IMPLANT
TRAY KYPHOPAK 15/2 EXPRESS (KITS) ×3 IMPLANT
TRAY KYPHOPAK 15/3 EXPRESS 1ST (MISCELLANEOUS) IMPLANT
TRAY KYPHOPAK 20/3 EXPRESS 1ST (MISCELLANEOUS) IMPLANT

## 2017-08-14 NOTE — Progress Notes (Signed)
Patient admitted to room 151. Patient is alert and oriented x 4. Verbalizes pain. Unable to rate. Skin assessment and admission completed. Patient was educated about safety and call system.

## 2017-08-14 NOTE — ED Notes (Signed)
Patient to OR via stretcher.  Patient belongings with family.

## 2017-08-14 NOTE — Progress Notes (Addendum)
Advance care planning  Discussed with patient with her son/daughter at bedside who are her HCPOAs. Discussed regarding her acute pain management plan and T12 compression fracture. Patient has been ambulating with a walker. Advised we will add morphine IV PRN and khyphoplasty will help pain We also discussed that she likely will have some chronic pain and debility due to other degenerative changes on spine.  Her pain control is her priority right now.  Discussed Code status. Patient and family at bedside would like patient to be a Full code. Orders entered  Time spent 20 minutes

## 2017-08-14 NOTE — Transfer of Care (Signed)
Immediate Anesthesia Transfer of Care Note  Patient: Traci Lee  Procedure(s) Performed: KYPHOPLASTY (N/A )  Patient Location: PACU  Anesthesia Type:General  Level of Consciousness: drowsy and patient cooperative  Airway & Oxygen Therapy: Patient Spontanous Breathing and Patient connected to nasal cannula oxygen  Post-op Assessment: Report given to RN and Post -op Vital signs reviewed and stable  Post vital signs: Reviewed and stable  Last Vitals:  Vitals:   08/14/17 1127 08/14/17 1415  BP: 134/69 (!) 118/59  Pulse: 85 82  Resp: 14 18  Temp: (!) 36.3 C (!) 36.4 C  SpO2: 100% 97%    Last Pain:  Vitals:   08/14/17 1127  TempSrc: Tympanic  PainSc:       Patients Stated Pain Goal: 4 (08/14/17 0735)  Complications: No apparent anesthesia complications

## 2017-08-14 NOTE — ED Triage Notes (Signed)
Arrives via [facility] from home.  Patient had back surgery 3 weeks ago.  States she has had ongoing pain since surgery, however pain worse today.  Since surgery patietn has not bbeen moving around as much.  Patient has been taking 2.5 - 5 mg Oxycodone Q4 hours.  Last medicated this morning at 0300 with 2.5 mg oxycodone.

## 2017-08-14 NOTE — H&P (Signed)
[facility] - [place] at [facility]   PATIENT NAME: Traci Lee    MR#:  [id number]  DATE OF BIRTH:  05-Feb-1934  DATE OF ADMISSION:  08/14/2017  PRIMARY CARE PHYSICIAN: Bayard Males Hermenia Fiscal, NP   REQUESTING/REFERRING PHYSICIAN: Dr. Cyril Loosen  CHIEF COMPLAINT:   Chief Complaint  Patient presents with  . Back Pain    HISTORY OF PRESENT ILLNESS:  Mega Kinkade  is a 84 y.o. female with a known history of hypertension, vertebral compression fracture with recent kyphoplasty returns due to intractable low back pain.  Patient has been ambulating with a walker.  Does not complain of any neuro deficits or numbness in her legs.  No change with her bowels or bladder.  Patient is being admitted for pain control and repeat kyphoplasty.  Patient has been seen by Dr. Rosita Kea of orthopedics in the emergency room.  PAST MEDICAL HISTORY:   Past Medical History:  Diagnosis Date  . Arthritis   . Hypertension   . Thyroid disease     PAST SURGICAL HISTORY:   Past Surgical History:  Procedure Laterality Date  . ABDOMINAL HYSTERECTOMY    . APPENDECTOMY    . TONSILLECTOMY    . TUBAL LIGATION    . VERTEBROPLASTY N/A 07/26/2017   Procedure: VERTEBROPLASTY/ SACROPLASTY S1;  Surgeon: Kennedy Bucker, MD;  Location: [facility] ORS;  Service: Orthopedics;  Laterality: N/A;    SOCIAL HISTORY:   Social History   Tobacco Use  . Smoking status: Never Smoker  . Smokeless tobacco: Never Used  Substance Use Topics  . Alcohol use: No    FAMILY HISTORY:   Family History  Problem Relation Age of Onset  . Breast cancer Neg Hx     DRUG ALLERGIES:   Allergies  Allergen Reactions  . Alendronate Nausea Only  . Penicillins Rash    Has patient had a PCN reaction causing immediate rash, facial/tongue/throat swelling, SOB or lightheadedness with hypotension: Yes Has patient had a PCN reaction causing severe rash involving mucus membranes or skin necrosis: No Has patient had a PCN reaction  that required hospitalization: No Has patient had a PCN reaction occurring within the last 10 years: No If all of the above answers are "NO", then may proceed with Cephalosporin use.     REVIEW OF SYSTEMS:   Review of Systems  Constitutional: Positive for malaise/fatigue. Negative for chills and fever.  HENT: Negative for sore throat.   Eyes: Negative for blurred vision, double vision and pain.  Respiratory: Negative for cough, hemoptysis, shortness of breath and wheezing.   Cardiovascular: Negative for chest pain, palpitations, orthopnea and leg swelling.  Gastrointestinal: Negative for abdominal pain, constipation, diarrhea, heartburn, nausea and vomiting.  Genitourinary: Negative for dysuria and hematuria.  Musculoskeletal: Positive for back pain. Negative for joint pain.  Skin: Negative for rash.  Neurological: Positive for weakness. Negative for sensory change, speech change, focal weakness and headaches.  Endo/Heme/Allergies: Does not bruise/bleed easily.  Psychiatric/Behavioral: Negative for depression. The patient is not nervous/anxious.     MEDICATIONS AT HOME:   Prior to Admission medications   Medication Sig Start Date End Date Taking? Authorizing Provider  aspirin EC 81 MG tablet Take 81 mg by mouth daily.   Yes [provider]  Calcium Carb-Cholecalciferol (CALCIUM+D3 PO) Take 1 tablet by mouth 2 (two) times daily.   Yes [provider]  cholecalciferol (VITAMIN D) 1000 units tablet Take 1,000 Units by mouth daily.   Yes [provider]  enalapril (VASOTEC) 20  MG tablet Take 20 mg by mouth daily.   Yes [provider]  levothyroxine (SYNTHROID, LEVOTHROID) 88 MCG tablet Take 88 mcg by mouth daily before breakfast.   Yes [provider]  Multiple Vitamin (MULTIVITAMIN WITH MINERALS) TABS tablet Take 1 tablet by mouth daily. Centrum Silver for Women   Yes [provider]  omeprazole (PRILOSEC) 20 MG capsule Take 20 mg  by mouth daily before breakfast.    Yes [provider]  oxybutynin (DITROPAN) 5 MG tablet Take 5 mg by mouth 2 (two) times daily.    Yes [provider]  oxycodone (OXY-IR) 5 MG capsule Take 2.5-5 mg by mouth every 4 (four) hours as needed.   Yes [provider]  Dextran 70-Hypromellose (NATURAL BALANCE TEARS) 0.1-0.3 % SOLN Place 1-2 drops into both eyes 2 (two) times daily as needed (NATURALE TEARS FOR CONTACTS AS NEEDED.).    [provider]  HYDROcodone-acetaminophen (NORCO) 5-325 MG tablet Take 1 tablet by mouth every 6 (six) hours as needed. Patient not taking: Reported on 08/14/2017 07/26/17   Kennedy Bucker, MD  polyethylene glycol [facility] / Ethelene Hal) packet Take 17 g by mouth daily as needed. For constipation 07/16/17   [provider]     VITAL SIGNS:  Blood pressure 124/79, pulse 85, temperature 97.9 F (36.6 C), resp. rate 16, height 5\' 2"  (1.575 m), weight 75.8 kg (167 lb), SpO2 98 %.  PHYSICAL EXAMINATION:  Physical Exam  GENERAL:  84 y.o.-year-old patient lying in the bed with severe distress due to back pain EYES: Pupils equal, round, reactive to light and accommodation. No scleral icterus. Extraocular muscles intact.  HEENT: Head atraumatic, normocephalic. Oropharynx and nasopharynx clear. No oropharyngeal erythema, moist oral mucosa  NECK:  Supple, no jugular venous distention. No thyroid enlargement, no tenderness.  LUNGS: Normal breath sounds bilaterally, no wheezing, rales, rhonchi. No use of accessory muscles of respiration.  CARDIOVASCULAR: S1, S2 normal. No murmurs, rubs, or gallops.  ABDOMEN: Soft, nontender, nondistended. Bowel sounds present. No organomegaly or mass.  EXTREMITIES: No pedal edema, cyanosis, or clubbing. + 2 pedal & radial pulses b/l.  Severe pain with straight leg raising of both lower extremities NEUROLOGIC: Cranial nerves II through XII are intact. No focal Motor or sensory deficits appreciated  b/l PSYCHIATRIC: The patient is alert and oriented x 3.  Anxious and tearful SKIN: No obvious rash, lesion, or ulcer.   LABORATORY PANEL:   CBC Recent Labs  Lab 08/14/17 0710  WBC 6.2  HGB 15.0  HCT 43.8  PLT 419   ------------------------------------------------------------------------------------------------------------------  Chemistries  Recent Labs  Lab 08/14/17 0710  NA 133*  K 4.4  CL 97*  CO2 23  GLUCOSE 130*  BUN 18  CREATININE 0.99  CALCIUM 9.3  AST 30  ALT 16  ALKPHOS 81  BILITOT 0.8   ------------------------------------------------------------------------------------------------------------------  Cardiac Enzymes No results for input(s): TROPONINI in the last 168 hours. ------------------------------------------------------------------------------------------------------------------  RADIOLOGY:  Dg Lumbar Spine 2-3 Views  Result Date: 08/14/2017 CLINICAL DATA:  84 year old female post sacroplasty 3 weeks ago. Pain since surgery worse today. Subsequent encounter. EXAM: LUMBAR SPINE - 2-3 VIEW COMPARISON:  07/13/2017 lumbar spine MR. FINDINGS: Marked T12 compression fracture with retropulsion. Degree of retropulsion difficult to adequately assess. 90% loss of height anteriorly similar to prior MR. Mild loss of height T7-T8.  Minimal loss of height T9. Prominent dextroscoliosis lumbar spine. Superimposed degenerative changes as noted on recent MR without significant change. Post sacroplasty. Calcified aorta. Gas-filled prominent size bowel  may be related to mild ileus. IMPRESSION: Marked T12 compression fracture with retropulsion. Degree of retropulsion difficult to adequately assess. 90% loss of height anteriorly similar to prior MR. Mild loss of height T7 and T8.  Minimal loss of height T9. Prominent dextroscoliosis lumbar spine. Superimposed degenerative changes as noted on recent MR without significant change. Post sacroplasty. Electronically Signed   By:  Lacy DuverneySteven  Olson M.D.   On: 08/14/2017 07:59   Dg Sacrum/coccyx  Result Date: 08/14/2017 CLINICAL DATA:  84 year old female post sacroplasty 3 weeks ago. Pain since surgery worse today. Subsequent encounter. EXAM: SACRUM AND COCCYX - 2+ VIEW COMPARISON:  Intraoperative exam 07/27/2015. Preoperative lumbar spine MR 07/13/2017. FINDINGS: Post bilateral S1 sacroplasty. Difficult to adequately assess relation of cement to sacral foramen. No new sacral fracture identified. Degenerative changes lumbar spine. IMPRESSION: Post bilateral S1 sacroplasty. No new sacral fracture identified. Electronically Signed   By: Lacy DuverneySteven  Olson M.D.   On: 08/14/2017 07:55     IMPRESSION AND PLAN:   *Low back pain with compression fracture and recent kyphoplasty.  Intractable pain in spite of multiple doses of fentanyl and IV morphine.  Patient will need repeat kyphoplasty as per orthopedics Dr. Rosita KeaMenz.  Will admit patient.  N.p.o.  The pain medications added.  Physical therapy to see after kyphoplasty.  *Hypertension.  Will add IV medications.  *DVT prophylaxis.  SCDs ordered.  Further DVT prophylaxis after surgery per orthopedics.  All the records are reviewed and case discussed with ED provider. Management plans discussed with the patient, family and they are in agreement.  CODE STATUS: FULL CODE  TOTAL TIME TAKING CARE OF THIS PATIENT: 40 minutes.   Molinda BailiffSrikar R Izeah Vossler M.D on 08/14/2017 at 10:42 AM  Between 7am to 6pm - Pager - [phone]  After 6pm go to [email or web address] - password [email or web address] [facility]  [facility] [place] [facility]  Office  [phone]  CC: Primary care physician; Myrene BuddyGauger, Sarah Kathryn, NP  Note: This dictation was prepared with Dragon dictation along with smaller phrase technology. Any transcriptional errors that result from this process are unintentional.

## 2017-08-14 NOTE — Op Note (Signed)
08/14/2017  2:20 PM  PATIENT:  Traci Lee  84 y.o. female  PRE-OPERATIVE DIAGNOSIS:  T12 fracture  POST-OPERATIVE DIAGNOSIS:  T12 fracture  PROCEDURE:  Procedure(s): KYPHOPLASTY (N/A)  SURGEON: Laurene Footman, MD  ASSISTANTS: None  ANESTHESIA:   local and MAC  EBL:  Total I/O In: 400 [I.V.:400] Out: 175 [Urine:175]  BLOOD ADMINISTERED:none  DRAINS: none   LOCAL MEDICATIONS USED:  MARCAINE    and XYLOCAINE   SPECIMEN:  Source of Specimen:  T12 vertebral body  DISPOSITION OF SPECIMEN:  PATHOLOGY  COUNTS:  YES  TOURNIQUET:  * No tourniquets in log *  IMPLANTS: Bone cement  DICTATION: .Dragon Dictation patient was brought to the operating room and after adequate sedation was obtained, patient was placed prone. C-arm was brought in and good visualization in both AP and lateral projections was obtained of T12. After patient identification and timeout procedures were completed, 5 cc 1% Xylocaine was infiltrated on both sides of the spine at the level of the planned incisions. After prepping and draping and repeat timeout procedure, spinal needle was used to get down to the pedicle on both sides and a 50-50 mix of 1% Xylocaine have percent Sensorcaine with epinephrine was injected. Small incisions were made and a transpedicular approach carried out with biopsy obtained. Drilling was carried out and inflation first on the right and on the left which gave significant correction of a very flattened T12. The right side was filled first and when it had set adequately the balloon was taken down on the left and also filled. There is good interdigitation and no extravasation with approximately 2 and half cc of cement on each side. When the cement had set the trochars removed and permanent C-arm views obtained. The wounds were closed with Dermabond followed by a Band-Aid  PLAN OF CARE: Admit to inpatient   PATIENT DISPOSITION:  PACU - hemodynamically stable.

## 2017-08-14 NOTE — ED Provider Notes (Signed)
[facility] Emergency Department Provider Note   ____________________________________________    I have reviewed the triage vital signs and the nursing notes.   HISTORY  Chief Complaint Back Pain     HPI Traci Lee is a 84 y.o. female who p/w back pain. Patient is 3 weeks s/p sacroplasty performed by Dr. Rosita KeaMenz. Had f/u appt 5 days ago, was having moderate to severe pain at that time, started on percocet with little improvement. Patient reports the pain is worse now, in her lower back b/l, and became quite intolerable overnight. No fevers/chills. No neuro deficits. No difficulty urinating. No difficulty with BM.   Past Medical History:  Diagnosis Date  . Arthritis   . Hypertension   . Thyroid disease     There are no active problems to display for this patient.   Past Surgical History:  Procedure Laterality Date  . ABDOMINAL HYSTERECTOMY    . APPENDECTOMY    . TONSILLECTOMY    . TUBAL LIGATION    . VERTEBROPLASTY N/A 07/26/2017   Procedure: VERTEBROPLASTY/ SACROPLASTY S1;  Surgeon: Kennedy BuckerMenz, Michael, MD;  Location: [facility] ORS;  Service: Orthopedics;  Laterality: N/A;    Prior to Admission medications   Medication Sig Start Date End Date Taking? Authorizing Provider  aspirin EC 81 MG tablet Take 81 mg by mouth daily.   Yes [provider]  Calcium Carb-Cholecalciferol (CALCIUM+D3 PO) Take 1 tablet by mouth 2 (two) times daily.   Yes [provider]  cholecalciferol (VITAMIN D) 1000 units tablet Take 1,000 Units by mouth daily.   Yes [provider]  enalapril (VASOTEC) 20 MG tablet Take 20 mg by mouth daily.   Yes [provider]  levothyroxine (SYNTHROID, LEVOTHROID) 88 MCG tablet Take 88 mcg by mouth daily before breakfast.   Yes [provider]  Multiple Vitamin (MULTIVITAMIN WITH MINERALS) TABS tablet Take 1 tablet by mouth daily. Centrum Silver for Women   Yes [provider]    omeprazole (PRILOSEC) 20 MG capsule Take 20 mg by mouth daily before breakfast.    Yes [provider]  oxybutynin (DITROPAN) 5 MG tablet Take 5 mg by mouth 2 (two) times daily.    Yes [provider]  oxycodone (OXY-IR) 5 MG capsule Take 2.5-5 mg by mouth every 4 (four) hours as needed.   Yes [provider]  Dextran 70-Hypromellose (NATURAL BALANCE TEARS) 0.1-0.3 % SOLN Place 1-2 drops into both eyes 2 (two) times daily as needed (NATURALE TEARS FOR CONTACTS AS NEEDED.).    [provider]  HYDROcodone-acetaminophen (NORCO) 5-325 MG tablet Take 1 tablet by mouth every 6 (six) hours as needed. Patient not taking: Reported on 08/14/2017 07/26/17   Kennedy BuckerMenz, Michael, MD  polyethylene glycol [facility](MIRALAX / Ethelene HalGLYCOLAX) packet Take 17 g by mouth daily as needed. For constipation 07/16/17   [provider]     Allergies Alendronate and Penicillins  Family History  Problem Relation Age of Onset  . Breast cancer Neg Hx     Social History Social History   Tobacco Use  . Smoking status: Never Smoker  . Smokeless tobacco: Never Used  Substance Use Topics  . Alcohol use: No  . Drug use: Not on file    Review of Systems  Constitutional: No fever/chills Eyes: No visual changes.  ENT: No sore throat. Cardiovascular: Denies chest pain. Respiratory: Denies shortness of breath. Gastrointestinal: No abdominal pain.   Genitourinary: negative for difficulty urinating Musculoskeletal: as above Skin:  Negative for rash. Neurological: Negative for focal weakness   ____________________________________________   PHYSICAL EXAM:  VITAL SIGNS: ED Triage Vitals  Enc Vitals Group     BP 08/14/17 0712 (!) 148/92     Pulse Rate 08/14/17 0710 (!) 107     Resp 08/14/17 0710 20     Temp 08/14/17 0712 97.9 F (36.6 C)     Temp Source 08/14/17 0710 Oral     SpO2 08/14/17 0710 95 %     Weight 08/14/17 0708 75.8 kg (167 lb)     Height 08/14/17 0708 1.575 m (5\' 2" )      Head Circumference --      Peak Flow --      Pain Score 08/14/17 0708 10     Pain Loc --      Pain Edu? --      Excl. in GC? --     Constitutional: Alert and oriented. Uncomfortable Eyes: Conjunctivae are normal.   Nose: No congestion/rhinnorhea. Mouth/Throat: Mucous membranes are moist.    Cardiovascular: Normal rate, regular rhythm.Good peripheral circulation. Respiratory: Normal respiratory effort.  No retractions.  Gastrointestinal: Soft and nontender. No distention.  No CVA tenderness.  Musculoskeletal: Back: Tenderness over T12. normal strength in the lower extremities. Normal reflexs Neurologic:  Normal speech and language. No gross focal neurologic deficits are appreciated.  Skin:  Skin is warm, dry and intact. No rash noted. Psychiatric: Mood and affect are normal. Speech and behavior are normal.  ____________________________________________   LABS (all labs ordered are listed, but only abnormal results are displayed)  Labs Reviewed  CBC - Abnormal; Notable for the following components:      Result Value   RDW 15.2 (*)    All other components within normal limits  COMPREHENSIVE METABOLIC PANEL - Abnormal; Notable for the following components:   Sodium 133 (*)    Chloride 97 (*)    Glucose, Bld 130 (*)    GFR calc non Af Amer 52 (*)    All other components within normal limits   ____________________________________________  EKG  ED ECG REPORT I, Jene Every, the attending physician, personally viewed and interpreted this ECG.  Date: 08/14/2017  Rate: 103 Rhythm: sinus tachycardia QRS Axis: normal Intervals: normal ST/T Wave abnormalities: normal Narrative Interpretation: no evidence of acute ischemia  ____________________________________________  RADIOLOGY  Xray sacrum Xray lumbar spine shows T12 compression fracture, not new ____________________________________________   PROCEDURES  Procedure(s) performed: No  Procedures   Critical  Care performed: No ____________________________________________   INITIAL IMPRESSION / ASSESSMENT AND PLAN / ED COURSE  Pertinent labs & imaging results that were available during my care of the patient were reviewed by me and considered in my medical decision making (see chart for details).  Patient p/w worsening pain s/p procedure. Will give IV fentanyl, check labs, obtain xrays and d/w ortho.  Patient is required multiple doses of IV fentanyl  T12 compression fracture on x-rays, this is not new and she had an MRI recently which also demonstrated this.  Discussed with Dr. Rosita Kea who recommends admission for likely kyphoplasty    ____________________________________________   FINAL CLINICAL IMPRESSION(S) / ED DIAGNOSES  Final diagnoses:  Compression fracture of body of thoracic vertebra [facility])        Note:  This document was prepared using Dragon voice recognition software and may include unintentional dictation errors.    Jene Every, MD 08/14/17 1014

## 2017-08-14 NOTE — Anesthesia Post-op Follow-up Note (Signed)
Anesthesia QCDR form completed.        

## 2017-08-14 NOTE — Anesthesia Preprocedure Evaluation (Signed)
Anesthesia Evaluation  Patient identified by MRN, date of birth, ID band Patient awake    Reviewed: Allergy & Precautions, H&P , NPO status , Patient's Chart, lab work & pertinent test results, reviewed documented beta blocker date and time   Airway Mallampati: II  TM Distance: >3 FB Neck ROM: full    Dental no notable dental hx. (+) Teeth Intact   Pulmonary neg pulmonary ROS,    Pulmonary exam normal breath sounds clear to auscultation       Cardiovascular Exercise Tolerance: Good hypertension, On Medications negative cardio ROS   Rhythm:regular Rate:Normal     Neuro/Psych negative neurological ROS  negative psych ROS   GI/Hepatic negative GI ROS, Neg liver ROS,   Endo/Other  negative endocrine ROSdiabetes  Renal/GU      Musculoskeletal   Abdominal   Peds  Hematology negative hematology ROS (+)   Anesthesia Other Findings   Reproductive/Obstetrics negative OB ROS                             Anesthesia Physical Anesthesia Plan  ASA: III  Anesthesia Plan: MAC   Post-op Pain Management:    Induction:   PONV Risk Score and Plan:   Airway Management Planned:   Additional Equipment:   Intra-op Plan:   Post-operative Plan:   Informed Consent: I have reviewed the patients History and Physical, chart, labs and discussed the procedure including the risks, benefits and alternatives for the proposed anesthesia with the patient or authorized representative who has indicated his/her understanding and acceptance.     Plan Discussed with: CRNA  Anesthesia Plan Comments:         Anesthesia Quick Evaluation

## 2017-08-14 NOTE — Progress Notes (Addendum)
Physical Therapy Evaluation Patient Details Name: Traci Lee MRN: [id number] DOB: 1933-10-16 Today's Date: 08/14/2017   History of Present Illness  Traci Lee Reason  is a 84 y.o. female with a known history of hypertension, vertebral compression fracture with recent kyphoplasty returns due to intractable low back pain.  Patient has been ambulating with a walker.  Does not complain of any neuro deficits or numbness in her legs.  No change with her bowels or bladder.  Patient is being admitted for pain control and repeat kyphoplasty.  Patient has been seen by Dr. Rosita KeaMenz of orthopedics in the emergency room.  Clinical Impression  Pt admitted with above diagnosis. Pt currently with functional limitations due to the deficits listed below (see PT Problem List).  Pt requires cues for log rolling and minA+1 to perform bed mobility. HOB flat but use of bed rails. Performed transfers to/from bed and commode with patient with CGA. Cues for safe hand placement. Increased time to perform but pt is stable in standing with UE support on rolling walker. Pt able to ambulate partially around RN station and back to room. She is mildly unstable but able to self correct with UE support. Suspect balance issues are due to recent surgery and pain medications. Pt denies DOE with ambulation. No pain reported during entire session. Pt is somewhat unsteady/groggy s/p kyphoplasty and would benefit from remaining in hospital tonight for additional PT session tomorrow. Recommend use of rolling walker at discharge as well as HH PT to improve strength/balance. Pt will benefit from PT services to address deficits in strength, balance, and mobility in order to return to full function at home.     Follow Up Recommendations Home health PT    Equipment Recommendations  Rolling walker with 5" wheels    Recommendations for Other Services       Precautions / Restrictions Precautions Precautions: Back;Other (comment)(Pt instructed  in log rolling and minimizing flexion) Restrictions Weight Bearing Restrictions: No      Mobility  Bed Mobility Overal bed mobility: Needs Assistance Bed Mobility: Supine to Sit     Supine to sit: Min assist     General bed mobility comments: Pt requires cues for log rolling and minA+1 to perform. HOB flat but use of bed rails  Transfers Overall transfer level: Needs assistance Equipment used: Rolling walker (2 wheeled) Transfers: Sit to/from Stand Sit to Stand: Min guard         General transfer comment: Performed transfers to/from bed and commode. Cues for safe hand placement. Increased time to perform but pt is stable in standing with UE support on rolling walker  Ambulation/Gait Ambulation/Gait assistance: Min guard Ambulation Distance (Feet): 150 Feet Assistive device: Rolling walker (2 wheeled) Gait Pattern/deviations: Decreased step length - right;Decreased step length - left Gait velocity: Decreased Gait velocity interpretation: <1.8 ft/sec, indicative of risk for recurrent falls General Gait Details: Pt able to ambulate partially around RN station and back to room. She is mildly unstable but able to self correct with UE support. Support balance issues are due to recent surgery and pain medications. Pt denies DOE with ambulation. Recommend use of rolling walker at discharge  Stairs            Wheelchair Mobility    Modified Rankin (Stroke Patients Only)       Balance Overall balance assessment: Needs assistance Sitting-balance support: No upper extremity supported Sitting balance-Leahy Scale: Good     Standing balance support: No upper extremity supported Standing balance-Leahy Scale:  Fair Standing balance comment: Able to maintain balance with support on rolling walker. Unable to balance when she lets go of walker                             Pertinent Vitals/Pain Pain Assessment: No/denies pain    Home Living Family/patient  expects to be discharged to:: Private residence Living Arrangements: Alone Available Help at Discharge: Family Type of Home: House Home Access: Stairs to enter Entrance Stairs-Rails: Doctor, general practice of Steps: 2 Home Layout: One level Home Equipment: Environmental consultant - 4 wheels;Bedside commode;Shower seat;Other (comment)(Lift chair)      Prior Function Level of Independence: Needs assistance   Gait / Transfers Assistance Needed: Ambulating with rolling walker since sacroplasty in 07/26/17.   ADL's / Homemaking Assistance Needed: Prior to sacroplasty in January 2019 pt was independent with ADLs/IADLs. Since 07/26/17 pt has needed assist with IADLs but is still independent with ADLs        Hand Dominance   Dominant Hand: Right    Extremity/Trunk Assessment   Upper Extremity Assessment Upper Extremity Assessment: Overall WFL for tasks assessed    Lower Extremity Assessment Lower Extremity Assessment: Generalized weakness       Communication   Communication: No difficulties  Cognition Arousal/Alertness: Awake/alert Behavior During Therapy: WFL for tasks assessed/performed Overall Cognitive Status: Within Functional Limits for tasks assessed                                        General Comments      Exercises     Assessment/Plan    PT Assessment Patient needs continued PT services  PT Problem List Decreased strength;Decreased balance;Decreased mobility       PT Treatment Interventions DME instruction;Gait training;Functional mobility training;Therapeutic activities;Therapeutic exercise;Balance training;Cognitive remediation    PT Goals (Current goals can be found in the Care Plan section)  Acute Rehab PT Goals Patient Stated Goal: Return to prior function at home PT Goal Formulation: With patient Time For Goal Achievement: 08/28/17 Potential to Achieve Goals: Good    Frequency 7X/week   Barriers to discharge Decreased caregiver  support Lives alone but has good support from family    Co-evaluation               AM-PAC PT "6 Clicks" Daily Activity  Outcome Measure Difficulty turning over in bed (including adjusting bedclothes, sheets and blankets)?: Unable Difficulty moving from lying on back to sitting on the side of the bed? : A Little Difficulty sitting down on and standing up from a chair with arms (e.g., wheelchair, bedside commode, etc,.)?: A Little Help needed moving to and from a bed to chair (including a wheelchair)?: A Little Help needed walking in hospital room?: A Little Help needed climbing 3-5 steps with a railing? : A Lot 6 Click Score: 15    End of Session Equipment Utilized During Treatment: Gait belt Activity Tolerance: Patient tolerated treatment well Patient left: in bed;with call bell/phone within reach;with bed alarm set;with family/visitor present Nurse Communication: Mobility status PT Visit Diagnosis: Unsteadiness on feet (R26.81);Other abnormalities of gait and mobility (R26.89);Muscle weakness (generalized) (M62.81)    Time: 1610-9604 PT Time Calculation (min) (ACUTE ONLY): 35 min   Charges:   PT Evaluation $PT Eval Low Complexity: 1 Low PT Treatments $Gait Training: 8-22 mins   PT G Codes:  Sharalyn Ink Huprich PT, DPT     Huprich,Jason 08/14/2017, 4:39 PM   Note addended upon discharge to add physician as co-signor Tommy Rainwater. Manson Passey, PT, DPT, NCS 08/27/17, 4:22 PM [phone]

## 2017-08-14 NOTE — Consult Note (Signed)
Patient is an 84 year old who had a recent sacral plasty. She initially was seen by neurosurgery for T12 compression fracture but her pain seems much slower and on MRI the noted insufficiency fracture of the sacrum. Since her sacral plasty she had some relief but still having a great deal low back pain. Over the past 2 days is gone debilitating and she is having trouble taking care of herself getting in and out of bed or and up into a chair. On exam her skin is intact over the back and with percussion she does have pain at T12 does radiate down. She has no clonus and she is able flex extend her toes.  Radiographs taken today show significant compression at T12 consistent with prior MRI Impression is T12 compression fracture Recommendation is for T12 kyphoplasty possibly today with her only having had a little bit of water today and not being on blood thinner. I did explain to her and her family that with the pain being quite a bit lower than the T 12 I cannot be certain of results but with the amount of pain she is and I think it is worth attempting this as it may give relief

## 2017-08-15 ENCOUNTER — Encounter: Payer: Self-pay | Admitting: Orthopedic Surgery

## 2017-08-15 DIAGNOSIS — M4854XA Collapsed vertebra, not elsewhere classified, thoracic region, initial encounter for fracture: Secondary | ICD-10-CM | POA: Diagnosis not present

## 2017-08-15 MED ORDER — METHOCARBAMOL 500 MG PO TABS: 500.0000 mg | ORAL_TABLET | Freq: Four times a day (QID) | ORAL | 0 refills | Status: DC | PRN

## 2017-08-15 MED ORDER — OXYCODONE HCL 5 MG PO TABS: 5.0000 mg | ORAL_TABLET | ORAL | 0 refills | Status: DC | PRN

## 2017-08-15 MED ORDER — METHOCARBAMOL 500 MG PO TABS
500.0000 mg | ORAL_TABLET | Freq: Four times a day (QID) | ORAL | Status: DC | PRN
  Administered 2017-08-15: 500 mg via ORAL
  Filled 2017-08-15: qty 1

## 2017-08-15 NOTE — Progress Notes (Signed)
Physical Therapy Treatment Patient Details Name: Traci Lee MRN: [id number] DOB: 1933-09-26 Today's Date: [name]2/12/2017    History of Present Illness Traci Lee  is a 84 y.o. female with a known history of hypertension, vertebral compression fracture with recent kyphoplasty returns due to intractable low back pain.  Patient has been ambulating with a walker.  Does not complain of any neuro deficits or numbness in her legs.  No change with her bowels or bladder.  Patient is being admitted for pain control and repeat kyphoplasty.  Patient has been seen by Dr. Rosita KeaMenz of orthopedics in the emergency room.    PT Comments    Pt making excellent progress with therapy on this date. She is much more stable with ambulation and is able to complete a full lap around RN station with therapist. Significant improvement in speed and stability today. No LOB with use of rolling walker. Denies DOE and no signs of respiratory distress. Attempted a few small steps in room without UE support however pt reports mild increase in pain. Pt with some balance deficits with feet together and in single leg stance. She also demonstrates decreased LE strength with sit to stand from commode. [facility] RW already delivered and is adjusted for patient during session. Pt will benefit from PT services to address deficits in strength, balance, and mobility in order to return to full function at home.     Follow Up Recommendations  Home health PT     Equipment Recommendations  Rolling walker with 5" wheels    Recommendations for Other Services       Precautions / Restrictions Precautions Precautions: Back;Other (comment)(Pt instructed in log rolling and minimizing flexion) Restrictions Weight Bearing Restrictions: Yes    Mobility  Bed Mobility Overal bed mobility: Needs Assistance Bed Mobility: Supine to Sit     Supine to sit: Min assist     General bed mobility comments: Pt  requires cues for log rolling and minA+1 to perform. HOB flat but use of bed rails. Improved sequencing today  Transfers Overall transfer level: Needs assistance Equipment used: Rolling walker (2 wheeled) Transfers: Sit to/from Stand Sit to Stand: Min guard         General transfer comment: Performed transfers to/from bed and commode. Cues for safe hand placement. Increased time to perform but pt is stable in standing with UE support on rolling walker. Improved sequencing demonstrated today with transfers  Ambulation/Gait Ambulation/Gait assistance: Min guard   Assistive device: Rolling walker (2 wheeled) Gait Pattern/deviations: Decreased step length - right;Decreased step length - left Gait velocity: Decreased Gait velocity interpretation: <1.8 ft/sec, indicative of risk for recurrent falls General Gait Details: Pt is able to complete a full lap around RN station with therapist. Significant improvement in speed and stability today. No LOB with use of rolling walker. Denies DOE and no signs of respiratory distress. Attempted a few small steps in room without UE support however pt reports increase in pain.    Stairs            Wheelchair Mobility    Modified Rankin (Stroke Patients Only)       Balance Overall balance assessment: Needs assistance Sitting-balance support: No upper extremity supported Sitting balance-Leahy Scale: Good     Standing balance support: No upper extremity supported Standing balance-Leahy Scale: Fair Standing balance comment: Able to maintain balance with support on rolling walker. Able to balance without UE support in wide and narrow stance.  Positive Rhomberg                            Cognition Arousal/Alertness: Awake/alert Behavior During Therapy: WFL for tasks assessed/performed Overall Cognitive Status: Within Functional Limits for tasks assessed                                        Exercises       General Comments        Pertinent Vitals/Pain Pain Assessment: 0-10 Pain Score: 1  Pain Location: Mild R hip pain/R low back pain Pain Descriptors / Indicators: Sharp Pain Intervention(s): Premedicated before session    Home Living                      Prior Function            PT Goals (current goals can now be found in the care plan section) Acute Rehab PT Goals Patient Stated Goal: Return to prior function at home PT Goal Formulation: With patient Time For Goal Achievement: 08/28/17 Potential to Achieve Goals: Good Progress towards PT goals: Progressing toward goals    Frequency    7X/week      PT Plan Current plan remains appropriate    Co-evaluation              AM-PAC PT "6 Clicks" Daily Activity  Outcome Measure  Difficulty turning over in bed (including adjusting bedclothes, sheets and blankets)?: Unable Difficulty moving from lying on back to sitting on the side of the bed? : A Little Difficulty sitting down on and standing up from a chair with arms (e.g., wheelchair, bedside commode, etc,.)?: A Little Help needed moving to and from a bed to chair (including a wheelchair)?: A Little Help needed walking in hospital room?: A Little Help needed climbing 3-5 steps with a railing? : A Little 6 Click Score: 16    End of Session Equipment Utilized During Treatment: Gait belt Activity Tolerance: Patient tolerated treatment well Patient left: in bed;with call bell/phone within reach;with bed alarm set;with family/visitor present;Other (comment)(Care Production designer, theatre/television/film with patient)   PT Visit Diagnosis: Unsteadiness on feet (R26.81);Other abnormalities of gait and mobility (R26.89);Muscle weakness (generalized) (M62.81)     Time: 0865-7846 PT Time Calculation (min) (ACUTE ONLY): 16 min  Charges:  $Gait Training: 8-22 mins                    G Codes:       Sharalyn Ink Allianna Beaubien PT, DPT     Fidelia Cathers 08/15/2017, 11:01 AM

## 2017-08-15 NOTE — Discharge Summary (Signed)
[facility] - Regino Ramirez at [facility]   PATIENT NAME: Traci Lee    MR#:  [id number]  DATE OF BIRTH:  [birth year]  DATE OF ADMISSION:  08/14/2017 ADMITTING PHYSICIAN: Milagros Loll, MD  DATE OF DISCHARGE: 08/15/2017  PRIMARY CARE PHYSICIAN: Myrene Buddy, NP    ADMISSION DIAGNOSIS:  Compression fracture of body of thoracic vertebra (HCC) [S22.000A]  DISCHARGE DIAGNOSIS:  T12 compression fracture s/p Kyphoplasty  SECONDARY DIAGNOSIS:   Past Medical History:  Diagnosis Date  . Arthritis   . Hypertension   . Thyroid disease     HOSPITAL COURSE:   Traci Lee  is a 84 y.o. female with a known history of hypertension, vertebral compression fracture with recent kyphoplasty returns due to intractable low back pain.  Patient has been ambulating with a walker  *Low back pain with compression fracture of T12 and recent kyphoplasty. -came in with  Intractable pain in spite of multiple doses of fentanyl and IV morphine.  - Patient is s/p repeat kyphoplasty of T 12 as per orthopedics Dr. Rosita Kea.  - pain medications prn  - Physical therapy recommends HHPT -prn Robaxin  *Hypertension.   -resume home meds  *DVT prophylaxis.  SCDs ordered.   Overall stable for d/c Dr Rosita Kea ok for  Pt to go home with HHPT.   CONSULTS OBTAINED:  Treatment Team:  Kennedy Bucker, MD  DRUG ALLERGIES:   Allergies  Allergen Reactions  . Alendronate Nausea Only  . Penicillins Rash    Has patient had a PCN reaction causing immediate rash, facial/tongue/throat swelling, SOB or lightheadedness with hypotension: Yes Has patient had a PCN reaction causing severe rash involving mucus membranes or skin necrosis: No Has patient had a PCN reaction that required hospitalization: No Has patient had a PCN reaction occurring within the last 10 years: No If all of the above answers are "NO", then may proceed with Cephalosporin use.     DISCHARGE MEDICATIONS:   Allergies  as of 08/15/2017      Reactions   Alendronate Nausea Only   Penicillins Rash   Has patient had a PCN reaction causing immediate rash, facial/tongue/throat swelling, SOB or lightheadedness with hypotension: Yes Has patient had a PCN reaction causing severe rash involving mucus membranes or skin necrosis: No Has patient had a PCN reaction that required hospitalization: No Has patient had a PCN reaction occurring within the last 10 years: No If all of the above answers are "NO", then may proceed with Cephalosporin use.      Medication List    STOP taking these medications   HYDROcodone-acetaminophen 5-325 MG tablet Commonly known as:  NORCO   oxycodone 5 MG capsule Commonly known as:  OXY-IR Replaced by:  oxyCODONE 5 MG immediate release tablet     TAKE these medications   aspirin EC 81 MG tablet Take 81 mg by mouth daily.   CALCIUM+D3 PO Take 1 tablet by mouth 2 (two) times daily.   cholecalciferol 1000 units tablet Commonly known as:  VITAMIN D Take 1,000 Units by mouth daily.   enalapril 20 MG tablet Commonly known as:  VASOTEC Take 20 mg by mouth daily.   levothyroxine 88 MCG tablet Commonly known as:  SYNTHROID, LEVOTHROID Take 88 mcg by mouth daily before breakfast.   methocarbamol 500 MG tablet Commonly known as:  ROBAXIN Take 1 tablet (500 mg total) by mouth every 6 (six) hours as needed for muscle spasms.   multivitamin with minerals Tabs tablet Take 1 tablet  by mouth daily. Centrum Silver for Women   NATURAL BALANCE TEARS 0.1-0.3 % Soln Generic drug:  Dextran 70-Hypromellose Place 1-2 drops into both eyes 2 (two) times daily as needed (NATURALE TEARS FOR CONTACTS AS NEEDED.).   omeprazole 20 MG capsule Commonly known as:  PRILOSEC Take 20 mg by mouth daily before breakfast.   oxybutynin 5 MG tablet Commonly known as:  DITROPAN Take 5 mg by mouth 2 (two) times daily.   oxyCODONE 5 MG immediate release tablet Commonly known as:  Oxy IR/ROXICODONE Take  1 tablet (5 mg total) by mouth every 4 (four) hours as needed for moderate pain. Replaces:  oxycodone 5 MG capsule   polyethylene glycol packet Commonly known as:  MIRALAX / GLYCOLAX Take 17 g by mouth daily as needed. For constipation       If you experience worsening of your admission symptoms, develop shortness of breath, life threatening emergency, suicidal or homicidal thoughts you must seek medical attention immediately by calling 911 or calling your MD immediately  if symptoms less severe.  You Must read complete instructions/literature along with all the possible adverse reactions/side effects for all the Medicines you take and that have been prescribed to you. Take any new Medicines after you have completely understood and accept all the possible adverse reactions/side effects.   Please note  You were cared for by a hospitalist during your hospital stay. If you have any questions about your discharge medications or the care you received while you were in the hospital after you are discharged, you can call the unit and asked to speak with the hospitalist on call if the hospitalist that took care of you is not available. Once you are discharged, your primary care physician will handle any further medical issues. Please note that NO REFILLS for any discharge medications will be authorized once you are discharged, as it is imperative that you return to your primary care physician (or establish a relationship with a primary care physician if you do not have one) for your aftercare needs so that they can reassess your need for medications and monitor your lab values. Today   SUBJECTIVE   Doing well  VITAL SIGNS:  Blood pressure 136/88, pulse 82, temperature 98.3 F (36.8 C), temperature source Oral, resp. rate 16, height 5\' 2"  (1.575 m), weight 77.2 kg (170 lb 3.1 oz), SpO2 95 %.  I/O:    Intake/Output Summary (Last 24 hours) at 08/15/2017 0816 Last data filed at 08/15/2017 0428 Gross  per 24 hour  Intake 1396.25 ml  Output 1275 ml  Net 121.25 ml    PHYSICAL EXAMINATION:  GENERAL:  84 y.o.-year-old patient lying in the bed with no acute distress.  EYES: Pupils equal, round, reactive to light and accommodation. No scleral icterus. Extraocular muscles intact.  HEENT: Head atraumatic, normocephalic. Oropharynx and nasopharynx clear.  NECK:  Supple, no jugular venous distention. No thyroid enlargement, no tenderness.  LUNGS: Normal breath sounds bilaterally, no wheezing, rales,rhonchi or crepitation. No use of accessory muscles of respiration.  CARDIOVASCULAR: S1, S2 normal. No murmurs, rubs, or gallops.  ABDOMEN: Soft, non-tender, non-distended. Bowel sounds present. No organomegaly or mass.  EXTREMITIES: No pedal edema, cyanosis, or clubbing.  NEUROLOGIC: Cranial nerves II through XII are intact. Muscle strength 5/5 in all extremities. Sensation intact. Gait not checked.  PSYCHIATRIC: The patient is alert and oriented x 3.  SKIN: No obvious rash, lesion, or ulcer.   DATA REVIEW:   CBC  Recent Labs  Lab 08/14/17  0710  WBC 6.2  HGB 15.0  HCT 43.8  PLT 419    Chemistries  Recent Labs  Lab 08/14/17 0710  NA 133*  K 4.4  CL 97*  CO2 23  GLUCOSE 130*  BUN 18  CREATININE 0.99  CALCIUM 9.3  AST 30  ALT 16  ALKPHOS 81  BILITOT 0.8    Microbiology Results   No results found for this or any previous visit (from the past 240 hour(s)).  RADIOLOGY:  Dg Thoracic Spine 2 View  Result Date: 08/14/2017 CLINICAL DATA:  Kyphoplasty at T12 EXAM: THORACIC SPINE 2 VIEWS; DG C-ARM 61-120 MIN COMPARISON:  None. FLUOROSCOPY TIME:  2 minutes 56 seconds; 7 acquired images FINDINGS: Frontal and lateral views obtained. There is a compression fracture of the T12 vertebral body with severe collapse of this vertebral body. Subsequent images show balloon placement within the leftward and rightward aspects of the T12 vertebral body with subsequent contrast administration for  kyphoplasty procedure. No appreciable extravasation of contrast material noted. Note that there is slight anterior wedging of the T11 vertebral body as well. IMPRESSION: Kyphoplasty procedure at T12 without appreciable contrast extravasation. Note that there is significant compression of the T12 vertebral body with modest anterior wedging at T11. Electronically Signed   By: Bretta Bang III M.D.   On: 08/14/2017 14:27   Dg Lumbar Spine 2-3 Views  Result Date: 08/14/2017 CLINICAL DATA:  84 year old female post sacroplasty 3 weeks ago. Pain since surgery worse today. Subsequent encounter. EXAM: LUMBAR SPINE - 2-3 VIEW COMPARISON:  07/13/2017 lumbar spine MR. FINDINGS: Marked T12 compression fracture with retropulsion. Degree of retropulsion difficult to adequately assess. 90% loss of height anteriorly similar to prior MR. Mild loss of height T7-T8.  Minimal loss of height T9. Prominent dextroscoliosis lumbar spine. Superimposed degenerative changes as noted on recent MR without significant change. Post sacroplasty. Calcified aorta. Gas-filled prominent size bowel may be related to mild ileus. IMPRESSION: Marked T12 compression fracture with retropulsion. Degree of retropulsion difficult to adequately assess. 90% loss of height anteriorly similar to prior MR. Mild loss of height T7 and T8.  Minimal loss of height T9. Prominent dextroscoliosis lumbar spine. Superimposed degenerative changes as noted on recent MR without significant change. Post sacroplasty. Electronically Signed   By: Lacy Duverney M.D.   On: 08/14/2017 07:59   Dg Sacrum/coccyx  Result Date: 08/14/2017 CLINICAL DATA:  84 year old female post sacroplasty 3 weeks ago. Pain since surgery worse today. Subsequent encounter. EXAM: SACRUM AND COCCYX - 2+ VIEW COMPARISON:  Intraoperative exam 07/27/2015. Preoperative lumbar spine MR 07/13/2017. FINDINGS: Post bilateral S1 sacroplasty. Difficult to adequately assess relation of cement to sacral  foramen. No new sacral fracture identified. Degenerative changes lumbar spine. IMPRESSION: Post bilateral S1 sacroplasty. No new sacral fracture identified. Electronically Signed   By: Lacy Duverney M.D.   On: 08/14/2017 07:55   Dg C-arm 1-60 Min  Result Date: 08/14/2017 CLINICAL DATA:  Kyphoplasty at T12 EXAM: THORACIC SPINE 2 VIEWS; DG C-ARM 61-120 MIN COMPARISON:  None. FLUOROSCOPY TIME:  2 minutes 56 seconds; 7 acquired images FINDINGS: Frontal and lateral views obtained. There is a compression fracture of the T12 vertebral body with severe collapse of this vertebral body. Subsequent images show balloon placement within the leftward and rightward aspects of the T12 vertebral body with subsequent contrast administration for kyphoplasty procedure. No appreciable extravasation of contrast material noted. Note that there is slight anterior wedging of the T11 vertebral body as well. IMPRESSION: Kyphoplasty procedure at  T12 without appreciable contrast extravasation. Note that there is significant compression of the T12 vertebral body with modest anterior wedging at T11. Electronically Signed   By: Bretta Bang III M.D.   On: 08/14/2017 14:27     Management plans discussed with the patient, family and they are in agreement.  CODE STATUS:     Code Status Orders  (From admission, onward)        Start     Ordered   08/14/17 1040  Full code  Continuous     08/14/17 1041    Code Status History    Date Active Date Inactive Code Status Order ID Comments User Context   This patient has a current code status but no historical code status.      TOTAL TIME TAKING CARE OF THIS PATIENT: 40 minutes.    Enedina Finner M.D on 08/15/2017 at 8:16 AM  Between 7am to 6pm - Pager - [phone] After 6pm go to [email or web address] - Social research officer, government  [facility]  Office  [phone]  CC: Primary care physician; Bayard Males Hermenia Fiscal, NP

## 2017-08-15 NOTE — Discharge Instructions (Signed)
Diet: As you were doing prior to hospitalization   Dressing: You may remove band-aid and shower. No soaking in tub.  Activity:  Increase activity slowly as tolerated, but follow the weight bearing instructions below. Avoid any lifting pushing or pulling.  Weight Bearing:   Weight bearing as tolerated   To prevent constipation: you may use a stool softener such as -  Colace (over the counter) 100 mg by mouth twice a day  Drink plenty of fluids (prune juice may be helpful) and high fiber foods Miralax (over the counter) for constipation as needed.    Itching:  If you experience itching with your medications, try taking only a single pain pill, or even half a pain pill at a time.  You may take up to 10 pain pills per day, and you can also use benadryl over the counter for itching or also to help with sleep.   Precautions:  If you experience chest pain or shortness of breath - call 911 immediately for transfer to the hospital emergency department!!  If you develop a fever greater that 101 F, purulent drainage from wound, increased redness or drainage from wound, or calf pain-Call [facility]                                               Follow- Up Appointment:  Please call for an appointment to be seen in 2 weeks at [facility]

## 2017-08-15 NOTE — Progress Notes (Signed)
   Subjective: 1 Day Post-Op Procedure(s) (LRB): KYPHOPLASTY (N/A) Patient reports pain as mild. Back pain is much improved. Patient is well, and has had no acute complaints or problems Denies any CP, SOB, ABD pain. We will continue therapy today.   Objective: Vital signs in last 24 hours: Temp:  [97.4 F (36.3 C)-98.7 F (37.1 C)] 98.3 F (36.8 C) (02/06 0750) Pulse Rate:  [66-90] 82 (02/06 0750) Resp:  [16-21] 16 (02/06 0750) BP: (98-140)/(52-88) 118/62 (02/06 0933) SpO2:  [90 %-100 %] 95 % (02/06 0750) Weight:  [77.2 kg (170 lb 3.1 oz)] 77.2 kg (170 lb 3.1 oz) (02/06 0415)  Intake/Output from previous day: 02/05 0701 - 02/06 0700 In: 1396.3 [I.V.:1396.3] Out: 1275 [Urine:1275] Intake/Output this shift: Total I/O In: 360 [P.O.:360] Out: 300 [Urine:300]  Recent Labs    08/14/17 0710  HGB 15.0   Recent Labs    08/14/17 0710  WBC 6.2  RBC 4.78  HCT 43.8  PLT 419   Recent Labs    08/14/17 0710  NA 133*  K 4.4  CL 97*  CO2 23  BUN 18  CREATININE 0.99  GLUCOSE 130*  CALCIUM 9.3   No results for input(s): LABPT, INR in the last 72 hours.  EXAM General - Patient is Alert, Appropriate and Oriented Extremity - Neurologically intact Neurovascular intact Sensation intact distally Intact pulses distally Dorsiflexion/Plantar flexion intact   Past Medical History:  Diagnosis Date  . Arthritis   . Hypertension   . Thyroid disease     Assessment/Plan:   1 Day Post-Op Procedure(s) (LRB): KYPHOPLASTY (N/A) Active Problems:   Low back pain  Estimated body mass index is 31.13 kg/m as calculated from the following:   Height as of this encounter: 5\' 2"  (1.575 m).   Weight as of this encounter: 77.2 kg (170 lb 3.1 oz). Advance diet Up with therapy Progress activity as tolerated Follow up with [facility] in 2 weeks for repeat xrays   T. Cranston Neighborhris Aurianna Earlywine, PA-C [facility] 08/15/2017, 12:15 PM

## 2017-08-15 NOTE — Progress Notes (Signed)
Discharge instructions reviewed with patient and son. [facility] in [place]. Notified patient of appointment scheduled. IV removed without complications. Patient d/c via w/c and sons.

## 2017-08-15 NOTE — Care Management Note (Signed)
Case Management Note  Patient Details  Name: Traci Lee MRN: [id number] Date of Birth: 13-Nov-1933  Subjective/Objective:      Admitted to [facility] following a kypoplasty. Lives alone. Son is Traci Lee [phone]). Last seen Dr. Harrington Challengerhies 07/04/18. Prescriptions are filled at [facility] in [place]. No home health. No skilled nursing. Ho home oxygen. Takes care of all basic activities of daily living herself. Life recliner in the home.             Action/Plan: Physical therapy evaluation completed. Recommending home with home health/physical therapy/rolling walker. Discussed agencies with Traci Lee. Choice [facility] services. [facility] will be providing [facility] walker Discharge to home today   Expected Discharge Date:  08/15/17               Expected Discharge Plan:     In-House Referral:   yes  Discharge planning Services   yes  Post Acute Care Choice:   yes Choice offered to:   Traci Lee  DME Arranged:   yes D[facility] Agency[facility]  HH Arranged:   Yes HH Agency[facility]   [facility]  Status of Service:     If discussed at Long Length of Stay Meetings, dates discussed:    Additional Comments:  Traci GreetBrenda S Fynley Chrystal, RN MSN CCM Care Management [phone] 08/15/2017, 10:56 AM

## 2017-08-15 NOTE — Progress Notes (Deleted)
Discharge note;  Discharge instructions given to pt. IV removed. Prescriptions faxed to pharmacy. Walker delivered to room for pt to take home. No questions from pt. Will assist to get dressed and pt ready to be discharged.

## 2017-08-16 LAB — SURGICAL PATHOLOGY

## 2017-08-21 NOTE — Anesthesia Postprocedure Evaluation (Signed)
Anesthesia Post Note  Patient: Traci Lee  Procedure(s) Performed: KYPHOPLASTY (N/A )  Anesthesia Type: MAC     Last Vitals:  Vitals:   08/15/17 0931 08/15/17 0933  BP: 98/63 118/62  Pulse:    Resp:    Temp:    SpO2:      Last Pain:  Vitals:   08/15/17 1033  TempSrc:   PainSc: Milan Reather Steller

## 2017-09-04 ENCOUNTER — Other Ambulatory Visit: Payer: Self-pay | Admitting: Orthopedic Surgery

## 2017-09-04 DIAGNOSIS — S32010A Wedge compression fracture of first lumbar vertebra, initial encounter for closed fracture: Secondary | ICD-10-CM

## 2017-09-05 ENCOUNTER — Ambulatory Visit
Admission: RE | Admit: 2017-09-05 | Discharge: 2017-09-05 | Disposition: A | Discharge status: Home / Self Care | Payer: Medicare Other | Source: Ambulatory Visit | Attending: Orthopedic Surgery | Admitting: Orthopedic Surgery

## 2017-09-05 DIAGNOSIS — S32010A Wedge compression fracture of first lumbar vertebra, initial encounter for closed fracture: Secondary | ICD-10-CM | POA: Insufficient documentation

## 2017-09-05 DIAGNOSIS — M48061 Spinal stenosis, lumbar region without neurogenic claudication: Secondary | ICD-10-CM | POA: Insufficient documentation

## 2017-09-05 DIAGNOSIS — R609 Edema, unspecified: Secondary | ICD-10-CM | POA: Diagnosis not present

## 2017-09-05 DIAGNOSIS — X58XXXA Exposure to other specified factors, initial encounter: Secondary | ICD-10-CM | POA: Insufficient documentation

## 2017-09-06 MED ORDER — CLINDAMYCIN PHOSPHATE 900 MG/50ML IV SOLN
900.0000 mg | Freq: Once | INTRAVENOUS | Status: AC
  Administered 2017-09-07: 900 mg via INTRAVENOUS

## 2017-09-07 ENCOUNTER — Encounter
Admission: RE | Disposition: A | Discharge status: Home / Self Care | Payer: Self-pay | Source: Ambulatory Visit | Attending: Orthopedic Surgery

## 2017-09-07 ENCOUNTER — Ambulatory Visit: Payer: Medicare Other

## 2017-09-07 ENCOUNTER — Ambulatory Visit: Payer: Medicare Other | Admitting: Anesthesiology

## 2017-09-07 ENCOUNTER — Other Ambulatory Visit: Payer: Self-pay

## 2017-09-07 ENCOUNTER — Ambulatory Visit
Admission: RE | Admit: 2017-09-07 | Discharge: 2017-09-08 | Disposition: A | Discharge status: Home / Self Care | Payer: Medicare Other | Source: Ambulatory Visit | Attending: Orthopedic Surgery | Admitting: Orthopedic Surgery

## 2017-09-07 ENCOUNTER — Encounter: Payer: Self-pay | Admitting: *Deleted

## 2017-09-07 DIAGNOSIS — Z880 Allergy status to penicillin: Secondary | ICD-10-CM | POA: Diagnosis not present

## 2017-09-07 DIAGNOSIS — R509 Fever, unspecified: Secondary | ICD-10-CM

## 2017-09-07 DIAGNOSIS — K219 Gastro-esophageal reflux disease without esophagitis: Secondary | ICD-10-CM | POA: Diagnosis not present

## 2017-09-07 DIAGNOSIS — S32010A Wedge compression fracture of first lumbar vertebra, initial encounter for closed fracture: Secondary | ICD-10-CM | POA: Diagnosis present

## 2017-09-07 DIAGNOSIS — Z79899 Other long term (current) drug therapy: Secondary | ICD-10-CM | POA: Diagnosis not present

## 2017-09-07 DIAGNOSIS — I10 Essential (primary) hypertension: Secondary | ICD-10-CM | POA: Diagnosis not present

## 2017-09-07 DIAGNOSIS — R7981 Abnormal blood-gas level: Secondary | ICD-10-CM

## 2017-09-07 DIAGNOSIS — M1990 Unspecified osteoarthritis, unspecified site: Secondary | ICD-10-CM | POA: Insufficient documentation

## 2017-09-07 DIAGNOSIS — T148XXA Other injury of unspecified body region, initial encounter: Secondary | ICD-10-CM

## 2017-09-07 DIAGNOSIS — E039 Hypothyroidism, unspecified: Secondary | ICD-10-CM | POA: Diagnosis not present

## 2017-09-07 DIAGNOSIS — K449 Diaphragmatic hernia without obstruction or gangrene: Secondary | ICD-10-CM | POA: Diagnosis not present

## 2017-09-07 HISTORY — DX: Gastro-esophageal reflux disease without esophagitis: K21.9

## 2017-09-07 HISTORY — DX: Personal history of other diseases of the digestive system: Z87.19

## 2017-09-07 HISTORY — PX: KYPHOPLASTY: SHX5884

## 2017-09-07 SURGERY — KYPHOPLASTY
Anesthesia: General | Wound class: Clean

## 2017-09-07 MED ORDER — IOPAMIDOL (ISOVUE-M 200) INJECTION 41%
INTRAMUSCULAR | Status: AC
Start: 2017-09-07 — End: 2017-09-07
  Filled 2017-09-07: qty 20

## 2017-09-07 MED ORDER — MIDAZOLAM HCL 2 MG/2ML IJ SOLN
INTRAMUSCULAR | Status: DC | PRN
  Administered 2017-09-07: 2 mg via INTRAVENOUS

## 2017-09-07 MED ORDER — FENTANYL CITRATE (PF) 100 MCG/2ML IJ SOLN
25.0000 ug | INTRAMUSCULAR | Status: AC | PRN
  Administered 2017-09-07 (×6): 25 ug via INTRAVENOUS

## 2017-09-07 MED ORDER — LIDOCAINE HCL 1 % IJ SOLN
INTRAMUSCULAR | Status: DC | PRN
  Administered 2017-09-07: 20 mL
  Administered 2017-09-07: 10 mL

## 2017-09-07 MED ORDER — FAMOTIDINE 20 MG PO TABS
ORAL_TABLET | ORAL | Status: AC
Start: 2017-09-07 — End: 2017-09-08
  Filled 2017-09-07: qty 1

## 2017-09-07 MED ORDER — FAMOTIDINE 20 MG PO TABS
20.0000 mg | ORAL_TABLET | Freq: Once | ORAL | Status: AC
Start: 2017-09-07 — End: 2017-09-07
  Administered 2017-09-07: 20 mg via ORAL

## 2017-09-07 MED ORDER — FENTANYL CITRATE (PF) 100 MCG/2ML IJ SOLN
50.0000 ug | Freq: Once | INTRAMUSCULAR | Status: AC
  Administered 2017-09-07: 50 ug via INTRAVENOUS

## 2017-09-07 MED ORDER — AZITHROMYCIN 500 MG PO TABS
500.0000 mg | ORAL_TABLET | Freq: Every day | ORAL | Status: DC
  Administered 2017-09-07 – 2017-09-08 (×2): 500 mg via ORAL
  Filled 2017-09-07 (×2): qty 1

## 2017-09-07 MED ORDER — KETAMINE HCL 50 MG/ML IJ SOLN
INTRAMUSCULAR | Status: DC | PRN
  Administered 2017-09-07 (×2): 25 mg via INTRAMUSCULAR

## 2017-09-07 MED ORDER — ONDANSETRON HCL 4 MG/2ML IJ SOLN: 4.0000 mg | Freq: Four times a day (QID) | INTRAMUSCULAR | Status: DC | PRN

## 2017-09-07 MED ORDER — ONDANSETRON HCL 4 MG PO TABS: 4.0000 mg | ORAL_TABLET | Freq: Four times a day (QID) | ORAL | Status: DC | PRN

## 2017-09-07 MED ORDER — MIDAZOLAM HCL 2 MG/2ML IJ SOLN
INTRAMUSCULAR | Status: AC
  Filled 2017-09-07: qty 2

## 2017-09-07 MED ORDER — LIDOCAINE HCL (PF) 1 % IJ SOLN
INTRAMUSCULAR | Status: AC
  Filled 2017-09-07: qty 60

## 2017-09-07 MED ORDER — CLINDAMYCIN PHOSPHATE 900 MG/50ML IV SOLN
INTRAVENOUS | Status: AC
  Filled 2017-09-07: qty 50

## 2017-09-07 MED ORDER — FENTANYL CITRATE (PF) 100 MCG/2ML IJ SOLN
INTRAMUSCULAR | Status: AC
  Administered 2017-09-07: 25 ug via INTRAVENOUS
  Filled 2017-09-07: qty 2

## 2017-09-07 MED ORDER — METOCLOPRAMIDE HCL 10 MG PO TABS: 5.0000 mg | ORAL_TABLET | Freq: Three times a day (TID) | ORAL | Status: DC | PRN

## 2017-09-07 MED ORDER — ONDANSETRON HCL 4 MG/2ML IJ SOLN: 4.0000 mg | Freq: Once | INTRAMUSCULAR | Status: DC | PRN

## 2017-09-07 MED ORDER — OXYCODONE HCL 5 MG PO TABS: 5.0000 mg | ORAL_TABLET | ORAL | 0 refills | Status: DC | PRN

## 2017-09-07 MED ORDER — FENTANYL CITRATE (PF) 100 MCG/2ML IJ SOLN
INTRAMUSCULAR | Status: DC | PRN
  Administered 2017-09-07: 50 ug via INTRAVENOUS

## 2017-09-07 MED ORDER — LACTATED RINGERS IV SOLN
INTRAVENOUS | Status: DC
  Administered 2017-09-07: 13:00:00 via INTRAVENOUS

## 2017-09-07 MED ORDER — METOCLOPRAMIDE HCL 5 MG/ML IJ SOLN: 5.0000 mg | Freq: Three times a day (TID) | INTRAMUSCULAR | Status: DC | PRN

## 2017-09-07 MED ORDER — PROPOFOL 10 MG/ML IV BOLUS
INTRAVENOUS | Status: AC
  Filled 2017-09-07: qty 20

## 2017-09-07 MED ORDER — BUPIVACAINE-EPINEPHRINE (PF) 0.5% -1:200000 IJ SOLN
INTRAMUSCULAR | Status: DC | PRN
  Administered 2017-09-07: 20 mL via PERINEURAL

## 2017-09-07 MED ORDER — ONDANSETRON HCL 4 MG/2ML IJ SOLN
INTRAMUSCULAR | Status: AC
  Filled 2017-09-07: qty 2

## 2017-09-07 MED ORDER — GABAPENTIN 300 MG PO CAPS
300.0000 mg | ORAL_CAPSULE | Freq: Three times a day (TID) | ORAL | Status: DC
  Administered 2017-09-07 – 2017-09-08 (×2): 300 mg via ORAL
  Filled 2017-09-07 (×2): qty 1

## 2017-09-07 MED ORDER — PROPOFOL 500 MG/50ML IV EMUL
INTRAVENOUS | Status: DC | PRN
  Administered 2017-09-07: 40 ug/kg/min via INTRAVENOUS

## 2017-09-07 MED ORDER — HYDROCODONE-ACETAMINOPHEN 5-325 MG PO TABS
1.0000 | ORAL_TABLET | ORAL | Status: DC | PRN
  Administered 2017-09-07 (×2): 2 via ORAL
  Administered 2017-09-07: 1 via ORAL
  Filled 2017-09-07 (×2): qty 2
  Filled 2017-09-07: qty 1

## 2017-09-07 MED ORDER — SODIUM CHLORIDE 0.9 % IV SOLN
INTRAVENOUS | Status: DC
  Administered 2017-09-07 – 2017-09-08 (×2): via INTRAVENOUS

## 2017-09-07 MED ORDER — HYDROCODONE-ACETAMINOPHEN 5-325 MG PO TABS
ORAL_TABLET | ORAL | Status: AC
  Administered 2017-09-07: 1 via ORAL
  Filled 2017-09-07: qty 1

## 2017-09-07 MED ORDER — FENTANYL CITRATE (PF) 100 MCG/2ML IJ SOLN
INTRAMUSCULAR | Status: AC
  Filled 2017-09-07: qty 2

## 2017-09-07 MED ORDER — BUPIVACAINE-EPINEPHRINE (PF) 0.5% -1:200000 IJ SOLN
INTRAMUSCULAR | Status: AC
  Filled 2017-09-07: qty 30

## 2017-09-07 SURGICAL SUPPLY — 17 items
ADH SKN CLS APL DERMABOND .7 (GAUZE/BANDAGES/DRESSINGS) ×1
CEMENT KYPHON CX01A KIT/MIXER (Cement) ×3 IMPLANT
DERMABOND ADVANCED (GAUZE/BANDAGES/DRESSINGS) ×2
DERMABOND ADVANCED .7 DNX12 (GAUZE/BANDAGES/DRESSINGS) ×1 IMPLANT
DEVICE BIOPSY BONE KYPHX (INSTRUMENTS) ×3 IMPLANT
DRAPE C-ARM XRAY 36X54 (DRAPES) ×3 IMPLANT
DURAPREP 26ML APPLICATOR (WOUND CARE) ×3 IMPLANT
GLOVE SURG SYN 9.0  PF PI (GLOVE) ×2
GLOVE SURG SYN 9.0 PF PI (GLOVE) ×1 IMPLANT
GOWN SRG 2XL LVL 4 RGLN SLV (GOWNS) ×1 IMPLANT
GOWN STRL NON-REIN 2XL LVL4 (GOWNS) ×3
GOWN STRL REUS W/ TWL LRG LVL3 (GOWN DISPOSABLE) ×1 IMPLANT
GOWN STRL REUS W/TWL LRG LVL3 (GOWN DISPOSABLE) ×3
PACK KYPHOPLASTY (MISCELLANEOUS) ×3 IMPLANT
STRAP SAFETY 5IN WIDE (MISCELLANEOUS) ×3 IMPLANT
TRAY KYPHOPAK 15/3 EXPRESS 1ST (MISCELLANEOUS) IMPLANT
TRAY KYPHOPAK 20/3 EXPRESS 1ST (MISCELLANEOUS) ×3 IMPLANT

## 2017-09-07 NOTE — Transfer of Care (Signed)
Immediate Anesthesia Transfer of Care Note  Patient: Traci Lee  Procedure(s) Performed: Cassell ClementKYPHOPLASTY-L1 (N/A )  Patient Location: PACU  Anesthesia Type:General  Level of Consciousness: awake, alert  and oriented  Airway & Oxygen Therapy: Patient Spontanous Breathing and Patient connected to nasal cannula oxygen  Post-op Assessment: Report given to RN and Post -op Vital signs reviewed and stable  Post vital signs: Reviewed and stable  Last Vitals:  Vitals:   09/07/17 1223  BP: 136/65  Pulse: (!) 53  Resp: 18  Temp: 37.1 C  SpO2: 94%    Last Pain:  Vitals:   09/07/17 1223  TempSrc: Tympanic  PainSc: 10-Worst pain ever         Complications: No apparent anesthesia complications

## 2017-09-07 NOTE — Progress Notes (Signed)
Patient admitted to room 143. Patient is alert and oriented. VS stable. Pt rates pain 4/10. [facility]. Patient educated about room and call light system. Family at bedside.

## 2017-09-07 NOTE — Anesthesia Postprocedure Evaluation (Signed)
Anesthesia Post Note  Patient: Ella Jubileedna Oliver Feng  Procedure(s) Performed: Cassell ClementKYPHOPLASTY-L1 (N/A )  Patient location during evaluation: PACU Anesthesia Type: General Level of consciousness: awake and alert Pain management: pain level controlled Vital Signs Assessment: post-procedure vital signs reviewed and stable Respiratory status: spontaneous breathing, nonlabored ventilation, respiratory function stable and patient connected to nasal cannula oxygen Cardiovascular status: blood pressure returned to baseline and stable Postop Assessment: no apparent nausea or vomiting Anesthetic complications: no     Last Vitals:  Vitals:   09/07/17 1845 09/07/17 2012  BP: 117/60 (!) 117/50  Pulse: (!) 112 (!) 110  Resp: 20 16  Temp: 36.8 C 36.9 C  SpO2: 95% 96%    Last Pain:  Vitals:   09/07/17 2025  TempSrc:   PainSc: 2                  Lenard SimmerAndrew Yarel Rushlow

## 2017-09-07 NOTE — H&P (Signed)
Reviewed paper H+P, will be scanned into chart. No changes noted.  

## 2017-09-07 NOTE — Op Note (Signed)
09/07/2017  4:01 PM  PATIENT:  Traci Lee  84 y.o. female  PRE-OPERATIVE DIAGNOSIS:  CLOSED WEDGE COMPRESSION FIRST LUMBAR VERTEBRA  POST-OPERATIVE DIAGNOSIS:  CLOSED WEDGE COMPRESSION FIRST LUMBAR VERTEBRA  PROCEDURE:  Procedure(s): KYPHOPLASTY-L1 (N/A)  SURGEON: Laurene Footman, MD  ASSISTANTS: None  ANESTHESIA:   local and MAC  EBL:  Total I/O In: 700 [I.V.:700] Out: 2 [Blood:2]  BLOOD ADMINISTERED:none  DRAINS: none   LOCAL MEDICATIONS USED:  MARCAINE    and XYLOCAINE   SPECIMEN:  Source of Specimen:  L1 vertebral body  DISPOSITION OF SPECIMEN:  PATHOLOGY  COUNTS:  YES  TOURNIQUET:  * No tourniquets in log *  IMPLANTS: Bone cement  DICTATION: .Dragon Dictation patient was brought to the operating room and after adequate sedation was given the patient was placed prone.  Serum was brought in in good visualization of L1 with after patient identification and timeout procedures were completed skin was prepped with alcohol and 10 cc 1% Xylocaine was infiltrated on the right side.  After prepping and draping the back in the usual sterile fashion repeat timeout procedure was carried out.  Spinal needle was then used to get blunt local anesthetic down to the pedicle on the right side with a 50-50 mix of 1% Xylocaine and half percent Sensorcaine with epinephrine.  After allowing the local to set, a small incision was made and a trocar advanced in extrapedicular fashion into the vertebral body care being taken to stay in line with the pedicle on the lateral and lateral to the medial wall of the pedicle on the AP until the vertebral body was entered.  A biopsy was obtained followed by drilling and inflation of a balloon to about 2 cc.  Cement was mixed and when it was the appropriate consistency proximally 4 cc was infiltrated into the vertebral body getting from left to right side superior to inferior endplates.  There appeared to be good fill with minimal extravasation.   After allowing the cement to set the trocar was removed and Dermabond used to close the skin followed by Band-Aid  PLAN OF CARE: Discharge to home after PACU  PATIENT DISPOSITION:  PACU - hemodynamically stable.

## 2017-09-07 NOTE — Anesthesia Post-op Follow-up Note (Signed)
Anesthesia QCDR form completed.        

## 2017-09-07 NOTE — Anesthesia Procedure Notes (Signed)
Date/Time: 09/07/2017 3:29 PM Performed by: Junious SilkNoles, Ravindra Baranek, CRNA Pre-anesthesia Checklist: Patient identified, Emergency Drugs available, Suction available, Patient being monitored and Timeout performed Oxygen Delivery Method: Nasal cannula

## 2017-09-07 NOTE — Discharge Instructions (Addendum)
AMBULATORY SURGERY  DISCHARGE INSTRUCTIONS   1) The drugs that you were given will stay in your system until tomorrow so for the next 24 hours you should not:  A) Drive an automobile B) Make any legal decisions C) Drink any alcoholic beverage   2) You may resume regular meals tomorrow.  Today it is better to start with liquids and gradually work up to solid foods.  You may eat anything you prefer, but it is better to start with liquids, then soup and crackers, and gradually work up to solid foods.   3) Please notify your doctor immediately if you have any unusual bleeding, trouble breathing, redness and pain at the surgery site, drainage, fever, or pain not relieved by medication.    4) Additional Instructions:    Please contact your physician with any problems or Same Day Surgery at [phone], Monday through Friday 6 am to 4 pm, or [place] at [facility] Main number at [phone].   Remove Band-Aid on Sunday and okay to leave it off.  Try to take it easy today and tomorrow Sunday if you feeling better, no lifting over 2-3 pounds

## 2017-09-07 NOTE — Progress Notes (Signed)
Patient had elevated temp in RR, intractable pain. CXr shows infiltrate. Will admit to extended recovery, add Gabapentin for pain and Zithamycin for possible pneumonia.

## 2017-09-07 NOTE — Anesthesia Preprocedure Evaluation (Signed)
Anesthesia Evaluation  Patient identified by MRN, date of birth, ID band Patient awake    Reviewed: Allergy & Precautions, NPO status , Patient's Chart, lab work & pertinent test results  History of Anesthesia Complications Negative for: history of anesthetic complications  Airway Mallampati: III       Dental   Pulmonary neg sleep apnea, neg COPD,           Cardiovascular hypertension, Pt. on medications (-) Past MI and (-) CHF (-) dysrhythmias (-) Valvular Problems/Murmurs     Neuro/Psych neg Seizures    GI/Hepatic Neg liver ROS, GERD  Medicated and Controlled,  Endo/Other  neg diabetesHypothyroidism   Renal/GU negative Renal ROS     Musculoskeletal   Abdominal   Peds  Hematology   Anesthesia Other Findings   Reproductive/Obstetrics                             Anesthesia Physical Anesthesia Plan  ASA: II  Anesthesia Plan: General   Post-op Pain Management:    Induction: Intravenous  PONV Risk Score and Plan: 3 and Dexamethasone, Ondansetron and Midazolam  Airway Management Planned: Nasal Cannula  Additional Equipment:   Intra-op Plan:   Post-operative Plan:   Informed Consent: I have reviewed the patients History and Physical, chart, labs and discussed the procedure including the risks, benefits and alternatives for the proposed anesthesia with the patient or authorized representative who has indicated his/her understanding and acceptance.     Plan Discussed with:   Anesthesia Plan Comments:         Anesthesia Quick Evaluation

## 2017-09-08 DIAGNOSIS — S32010A Wedge compression fracture of first lumbar vertebra, initial encounter for closed fracture: Secondary | ICD-10-CM | POA: Diagnosis not present

## 2017-09-08 MED ORDER — HYDROCODONE-ACETAMINOPHEN 5-325 MG PO TABS: 1.0000 | ORAL_TABLET | ORAL | 0 refills | Status: AC | PRN

## 2017-09-08 MED ORDER — AZITHROMYCIN 250 MG PO TABS: ORAL_TABLET | ORAL | 0 refills | Status: AC

## 2017-09-08 MED ORDER — SODIUM CHLORIDE 0.9 % IV BOLUS (SEPSIS)
500.0000 mL | Freq: Once | INTRAVENOUS | Status: AC
  Administered 2017-09-08: 500 mL via INTRAVENOUS

## 2017-09-08 NOTE — Evaluation (Signed)
Physical Therapy Evaluation Patient Details Name: Traci Lee MRN: [id number] DOB: 1934-07-02 Today's Date: 09/08/2017   History of Present Illness  Traci Lee  is a 84 y.o. female with a known history of hypertension, vertebral compression fracture with recent kyphoplasty returns due to intractable low back pain.  Patient has been ambulating with a walker at home since onset of pain in later december, [place]   Clinical Impression  Pt admitted with above diagnosis. Pt currently with functional limitations due to the deficits listed below (see "PT Problem List"). Upon entry, the patient is received semirecumbent in bed, student RN in room assisting pt to bathroom. The pt is awake and agreeable to participate. No acute distress noted at this time.  Pt received on 2LPM O2 c SpO2: 86%, trialed on 3LPM c SpO2: 100%. BP drops upon standing: 105/5559mm Hg seated to 98/3762mm Hg standing.  Functional mobility assessment demonstrates mild-moderate strength impairment, the pt now requiring maximal effort to perform transfers, and AMB is slow and cautious without overt LOB, whereas the patient performed these at a higher level of independence PTA. Empirically, the patient demonstrates increased risk of recurrent falls AEB gait speed <0.4028m/s, forward reach <5", and multiple LOB demonstrated throughout session. Pt will benefit from skilled PT intervention to increase independence and safety with basic mobility in preparation for discharge to the venue listed below.       Follow Up Recommendations Home health PT    Equipment Recommendations  None recommended by PT    Recommendations for Other Services       Precautions / Restrictions Precautions Precautions: Fall Restrictions Weight Bearing Restrictions: No      Mobility  Bed Mobility Overal bed mobility: Needs Assistance Bed Mobility: Supine to Sit     Supine to sit: Supervision         Transfers Overall transfer level: Needs assistance Equipment used: Rolling walker (2 wheeled) Transfers: Sit to/from Stand Sit to Stand: Min guard;Min assist;From elevated surface         General transfer comment: very weak, requires significant trunk flexion to perform  Ambulation/Gait Ambulation/Gait assistance: Supervision Ambulation Distance (Feet): 15 Feet(EOB to toilet; additional AMB limited d/t BP drop) Assistive device: Rolling walker (2 wheeled)          Stairs            Wheelchair Mobility    Modified Rankin (Stroke Patients Only)       Balance                                             Pertinent Vitals/Pain Pain Assessment: 0-10 Pain Score: 1  Pain Location: Right posterior hip/pelvis Pain Descriptors / Indicators: Aching Pain Intervention(s): Limited activity within patient's tolerance;Monitored during session;Premedicated before session    Home Living Family/patient expects to be discharged to:: Private residence Living Arrangements: Alone Available Help at Discharge: Family;Available PRN/intermittently(2 sons, possible her sister) Type of Home: House Home Access: Stairs to enter Entrance Stairs-Rails: Doctor, general practiceight;Left Entrance Stairs-Number of Steps: 2 Home Layout: One level Home Equipment: Walker - 4 wheels;Bedside commode;Shower seat;Other (comment)      Prior Function Level of Independence: Needs assistance   Gait / Transfers Assistance Needed: Ambulating with rolling walker since sacroplasty in 07/26/17.   ADL's / Homemaking Assistance Needed: Prior to sacroplasty in January 2019 pt was independent  with ADLs/IADLs. Since 07/26/17 pt has needed assist with IADLs but is still independent with ADLs. Persistent pain since December 2018        Hand Dominance   Dominant Hand: Right    Extremity/Trunk Assessment        Lower Extremity Assessment Lower Extremity Assessment: Generalized weakness;Overall WFL for  tasks assessed       Communication   Communication: No difficulties  Cognition Arousal/Alertness: Awake/alert Behavior During Therapy: WFL for tasks assessed/performed                                          General Comments      Exercises     Assessment/Plan    PT Assessment Patient needs continued PT services  PT Problem List Decreased strength;Decreased range of motion;Decreased activity tolerance;Decreased mobility;Pain       PT Treatment Interventions DME instruction;Balance training;Gait training;Stair training;Functional mobility training;Therapeutic activities;Therapeutic exercise;Patient/family education    PT Goals (Current goals can be found in the Care Plan section)  Acute Rehab PT Goals Patient Stated Goal: return to work adn independent mobility PT Goal Formulation: With patient Time For Goal Achievement: 09/22/17 Potential to Achieve Goals: Good    Frequency 7X/week   Barriers to discharge Decreased caregiver support intermittent check-ins from Sons available only    Co-evaluation               AM-PAC PT "6 Clicks" Daily Activity  Outcome Measure Difficulty turning over in bed (including adjusting bedclothes, sheets and blankets)?: A Lot Difficulty moving from lying on back to sitting on the side of the bed? : A Lot Difficulty sitting down on and standing up from a chair with arms (e.g., wheelchair, bedside commode, etc,.)?: A Lot Help needed moving to and from a bed to chair (including a wheelchair)?: A Lot Help needed walking in hospital room?: A Little Help needed climbing 3-5 steps with a railing? : A Lot 6 Click Score: 13    End of Session Equipment Utilized During Treatment: Gait belt;Oxygen Activity Tolerance: Patient tolerated treatment well;Patient limited by fatigue Patient left: in bed;with family/visitor present;with nursing/sitter in room Nurse Communication: Mobility status PT Visit Diagnosis: Unsteadiness on  feet (R26.81);Difficulty in walking, not elsewhere classified (R26.2);Muscle weakness (generalized) (M62.81)    Time: 1610-9604 PT Time Calculation (min) (ACUTE ONLY): 15 min   Charges:   PT Evaluation $PT Eval Moderate Complexity: 1 Mod     PT G Codes:       2:05 PM, 2017-09-26 Rosamaria Lints, PT, DPT Physical Therapist - [facility]  [phone] (ASCOM)   \ Buccola,Allan C 09-26-2017, 2:02 PM

## 2017-09-08 NOTE — Discharge Summary (Signed)
Physician Discharge Summary  Patient ID: Traci Lee MRN: [id number] DOB/AGE: October 17, 1933 84 y.o.  Admit date: 09/07/2017 Discharge date: 09/08/2017  Admission Diagnoses:  CLOSED WEDGE COMPRESSION FIRST LUMBAR VERTEBRA   Discharge Diagnoses: Patient Active Problem List   Diagnosis Date Noted  . Closed compression fracture of L1 lumbar vertebra (HCC) 09/07/2017  . Low back pain 08/14/2017    Past Medical History:  Diagnosis Date  . Arthritis   . GERD (gastroesophageal reflux disease)   . History of hiatal hernia   . Hypertension   . Thyroid disease      Transfusion: none   Consultants (if any):   Discharged Condition: Improved  Hospital Course: Traci Lee is an 84 y.o. female who was admitted 09/07/2017 with a diagnosis of L1 compression fracture and went to the operating room on 09/07/2017 and underwent the above named procedures.    Surgeries: Procedure(s): KYPHOPLASTY-L1 on 09/07/2017 Patient tolerated the surgery well. Taken to PACU where she was stabilized and then transferred to the orthopedic floor.  On postop day 1 patient was able to stand and walk to bedside commode.  She had less back pain and prior surgery.  She tolerated physical therapy well.  Pain was mild and much improved.  Patient was stable and ready for discharge to home with home health PT.   She was given perioperative antibiotics:  Anti-infectives (From admission, onward)   Start     Dose/Rate Route Frequency Ordered Stop   09/08/17 0000  azithromycin (ZITHROMAX Z-PAK) 250 MG tablet        09/08/17 0853     09/07/17 1745  azithromycin (ZITHROMAX) tablet 500 mg     500 mg Oral Daily 09/07/17 1740     09/07/17 1223  clindamycin (CLEOCIN) 900 MG/50ML IVPB    Comments:  [name]  : cabinet override      09/07/17 1223 09/07/17 1515   09/06/17 2200  clindamycin (CLEOCIN) IVPB 900 mg     900 mg 100 mL/hr over 30 Minutes Intravenous  Once 09/06/17 2148 09/07/17 1525     .  Marland Kitchen  She benefited maximally from the hospital stay and there were no complications.    Recent vital signs:  Vitals:   09/08/17 0545 09/08/17 0733  BP: (!) 91/54 (!) 88/47  Pulse: 78 75  Resp: 16 16  Temp: 97.9 F (36.6 C) 97.6 F (36.4 C)  SpO2: 99% 100%    Recent laboratory studies:  Lab Results  Component Value Date   HGB 15.0 08/14/2017   Lab Results  Component Value Date   WBC 6.2 08/14/2017   PLT 419 08/14/2017   No results found for: INR Lab Results  Component Value Date   NA 133 (L) 08/14/2017   K 4.4 08/14/2017   CL 97 (L) 08/14/2017   CO2 23 08/14/2017   BUN 18 08/14/2017   CREATININE 0.99 08/14/2017   GLUCOSE 130 (H) 08/14/2017    Discharge Medications:   Allergies as of 09/08/2017      Reactions   Alendronate Nausea Only   Penicillins Rash   Has patient had a PCN reaction causing immediate rash, facial/tongue/throat swelling, SOB or lightheadedness with hypotension: Yes Has patient had a PCN reaction causing severe rash involving mucus membranes or skin necrosis: No Has patient had a PCN reaction that required hospitalization: No Has patient had a PCN reaction occurring within the last 10 years: No If all of the above answers are "NO", then may proceed with Cephalosporin  use.      Medication List    STOP taking these medications   methocarbamol 500 MG tablet Commonly known as:  ROBAXIN   oxyCODONE 5 MG immediate release tablet Commonly known as:  Oxy IR/ROXICODONE     TAKE these medications   aspirin EC 81 MG tablet Take 81 mg by mouth daily.   azithromycin 250 MG tablet Commonly known as:  ZITHROMAX Z-PAK Take 2 tablets (500 mg) on  Day 1,  followed by 1 tablet (250 mg) once daily on Days 2 through 5.   CALCIUM+D3 PO Take 1 tablet by mouth 2 (two) times daily.   cholecalciferol 1000 units tablet Commonly known as:  VITAMIN D Take 1,000 Units by mouth daily.   cyclobenzaprine 10 MG tablet Commonly known as:  FLEXERIL Take 10  mg by mouth 3 (three) times daily as needed for muscle spasms.   enalapril 20 MG tablet Commonly known as:  VASOTEC Take 20 mg by mouth daily.   HYDROcodone-acetaminophen 5-325 MG tablet Commonly known as:  NORCO/VICODIN Take 1-2 tablets by mouth every 4 (four) hours as needed for moderate pain.   levothyroxine 88 MCG tablet Commonly known as:  SYNTHROID, LEVOTHROID Take 88 mcg by mouth daily before breakfast.   multivitamin with minerals Tabs tablet Take 1 tablet by mouth daily. Centrum Silver for Women   NATURAL BALANCE TEARS 0.1-0.3 % Soln Generic drug:  Dextran 70-Hypromellose Place 1-2 drops into both eyes 2 (two) times daily as needed (dry eyes).   omeprazole 20 MG capsule Commonly known as:  PRILOSEC Take 20 mg by mouth daily before breakfast.   oxybutynin 5 MG tablet Commonly known as:  DITROPAN Take 5 mg by mouth 2 (two) times daily.   polyethylene glycol packet Commonly known as:  MIRALAX / GLYCOLAX Take 17 g by mouth daily. For constipation       Diagnostic Studies: X-ray Chest Pa Or Ap  Result Date: 09/07/2017 CLINICAL DATA:  Hypoxia EXAM: CHEST 1 VIEW COMPARISON:  None. FINDINGS: Eventration of the right hemidiaphragm. Left lung is essentially clear. No pleural effusion or pneumothorax. The heart is normal in size. Moderate to large hiatal hernia. Prior vertebral augmentation at T12 and L1. IMPRESSION: Eventration of the right hemidiaphragm. Moderate to large hiatal hernia. Electronically Signed   By: Charline BillsSriyesh  Krishnan M.D.   On: 09/07/2017 16:51   Dg Thoracic Spine 2 View  Result Date: 08/14/2017 CLINICAL DATA:  Kyphoplasty at T12 EXAM: THORACIC SPINE 2 VIEWS; DG C-ARM 61-120 MIN COMPARISON:  None. FLUOROSCOPY TIME:  2 minutes 56 seconds; 7 acquired images FINDINGS: Frontal and lateral views obtained. There is a compression fracture of the T12 vertebral body with severe collapse of this vertebral body. Subsequent images show balloon placement within the leftward  and rightward aspects of the T12 vertebral body with subsequent contrast administration for kyphoplasty procedure. No appreciable extravasation of contrast material noted. Note that there is slight anterior wedging of the T11 vertebral body as well. IMPRESSION: Kyphoplasty procedure at T12 without appreciable contrast extravasation. Note that there is significant compression of the T12 vertebral body with modest anterior wedging at T11. Electronically Signed   By: Bretta BangWilliam  Woodruff III M.D.   On: 08/14/2017 14:27   Dg Lumbar Spine 2-3 Views  Result Date: 09/07/2017 CLINICAL DATA:  Kyphoplasty EXAM: LUMBAR SPINE - 2-3 VIEW; DG C-ARM 61-120 MIN COMPARISON:  09/05/2017 lumbar spine MRI FINDINGS: Fluoroscopy time 1 minutes 54 seconds. Postsurgical changes from kyphoplasty of the severe T12 vertebral compression  fracture again noted. Spot fluoroscopic intraoperative radiographs of the upper lumbar spine demonstrate new kyphoplasty of the mild L1 vertebral compression fracture with methylmethacrylate material overlying the L1 vertebral body, with a small amount of methylmethacrylate material extending posterior to the L1 vertebral body, located either within the venous plexus or epidural space. IMPRESSION: Intraoperative fluoroscopic guidance for L1 kyphoplasty as detailed. Electronically Signed   By: Delbert Phenix M.D.   On: 09/07/2017 16:23   Dg Lumbar Spine 2-3 Views  Result Date: 08/14/2017 CLINICAL DATA:  84 year old female post sacroplasty 3 weeks ago. Pain since surgery worse today. Subsequent encounter. EXAM: LUMBAR SPINE - 2-3 VIEW COMPARISON:  07/13/2017 lumbar spine MR. FINDINGS: Marked T12 compression fracture with retropulsion. Degree of retropulsion difficult to adequately assess. 90% loss of height anteriorly similar to prior MR. Mild loss of height T7-T8.  Minimal loss of height T9. Prominent dextroscoliosis lumbar spine. Superimposed degenerative changes as noted on recent MR without significant  change. Post sacroplasty. Calcified aorta. Gas-filled prominent size bowel may be related to mild ileus. IMPRESSION: Marked T12 compression fracture with retropulsion. Degree of retropulsion difficult to adequately assess. 90% loss of height anteriorly similar to prior MR. Mild loss of height T7 and T8.  Minimal loss of height T9. Prominent dextroscoliosis lumbar spine. Superimposed degenerative changes as noted on recent MR without significant change. Post sacroplasty. Electronically Signed   By: Lacy Duverney M.D.   On: 08/14/2017 07:59   Dg Sacrum/coccyx  Result Date: 08/14/2017 CLINICAL DATA:  84 year old female post sacroplasty 3 weeks ago. Pain since surgery worse today. Subsequent encounter. EXAM: SACRUM AND COCCYX - 2+ VIEW COMPARISON:  Intraoperative exam 07/27/2015. Preoperative lumbar spine MR 07/13/2017. FINDINGS: Post bilateral S1 sacroplasty. Difficult to adequately assess relation of cement to sacral foramen. No new sacral fracture identified. Degenerative changes lumbar spine. IMPRESSION: Post bilateral S1 sacroplasty. No new sacral fracture identified. Electronically Signed   By: Lacy Duverney M.D.   On: 08/14/2017 07:55   Mr Lumbar Spine Wo Contrast  Result Date: 09/05/2017 CLINICAL DATA:  Right hip pain over the last 10 weeks. Previous kyphoplasty. EXAM: MRI LUMBAR SPINE WITHOUT CONTRAST TECHNIQUE: Multiplanar, multisequence MR imaging of the lumbar spine was performed. No intravenous contrast was administered. COMPARISON:  Procedural images 2 11/2017.  MRI 07/13/2017. FINDINGS: Segmentation:  5 lumbar type vertebral bodies. Alignment: Thoracolumbar curvature convex to the left and lumbar curvature convex to the right. 3 mm anterolisthesis L4-5. Vertebrae: Previous compression fracture at T12 treated with kyphoplasty. Methylmethacrylate present within the vertebral body. Persistent edema of the bone consistent with ongoing healing. Retropulsion measured at 11 mm. Compared to the  preoperative study, retropulsion was measured at 9 mm. This could represent mild worsening. No other regional fracture. Minimal superior endplate edema at L1 could represent a minimal adjacent level injury. Distant bilateral sacroplasty without edema. Conus medullaris and cauda equina: Conus extends to the L1 level. Spinal stenosis at the T12 level could affect the neural structures. Paraspinal and other soft tissues: Otherwise negative Disc levels: T11-12, T12 and T12-L1: Retropulsion of 11 mm as noted. Spinal stenosis with potential to compress the distal cord/conus/nerve roots. Question if this retropulsion is 1 or 2 mm worse than on the preprocedure exam. L1-2: Endplate osteophytes and mild bulging of the disc. Facet hypertrophy on the left. Mild stenosis of the left lateral recess. L2-3: Endplate osteophytes and bulging of the disc. Mild facet hypertrophy. No compressive stenosis. L3-4: Endplate osteophytes and bulging of the disc. Bilateral facet hypertrophy. Mild stenosis  of both lateral recesses without definite neural compression. L4-5: Bilateral facet arthropathy with 3 mm anterolisthesis. Endplate osteophytes and bulging of the disc. Severe spinal stenosis at this level that could cause neural compression on either or both sides. No change since the previous exam. L5-S1: Bilateral facet arthropathy with 2 mm of anterolisthesis. No disc pathology. No stenosis. Previous sacroplasty without evidence of recurrent fracture. IMPRESSION: Interval kyphoplasty at the T12 level. Methylmethacrylate appears well positioned. Persistent marrow edema consistent with ongoing healing. Retropulsed bone measured at 11 mm today. I measure this as 9 mm on the previous study. Spinal stenosis at this level that would have potential to cause neural compression. Minimal superior endplate edema at L1 that could represent a minimal adjacent level insufficiency fracture. Old healed bilateral sacroplasty. Curvature and degenerative  changes throughout the lumbar region as outlined above. There is severe multifactorial stenosis at the L4-5 level that could be symptomatic. Electronically Signed   By: Paulina Fusi M.D.   On: 09/05/2017 09:55   Dg C-arm 1-60 Min  Result Date: 09/07/2017 CLINICAL DATA:  Kyphoplasty EXAM: LUMBAR SPINE - 2-3 VIEW; DG C-ARM 61-120 MIN COMPARISON:  09/05/2017 lumbar spine MRI FINDINGS: Fluoroscopy time 1 minutes 54 seconds. Postsurgical changes from kyphoplasty of the severe T12 vertebral compression fracture again noted. Spot fluoroscopic intraoperative radiographs of the upper lumbar spine demonstrate new kyphoplasty of the mild L1 vertebral compression fracture with methylmethacrylate material overlying the L1 vertebral body, with a small amount of methylmethacrylate material extending posterior to the L1 vertebral body, located either within the venous plexus or epidural space. IMPRESSION: Intraoperative fluoroscopic guidance for L1 kyphoplasty as detailed. Electronically Signed   By: Delbert Phenix M.D.   On: 09/07/2017 16:23   Dg C-arm 1-60 Min  Result Date: 08/14/2017 CLINICAL DATA:  Kyphoplasty at T12 EXAM: THORACIC SPINE 2 VIEWS; DG C-ARM 61-120 MIN COMPARISON:  None. FLUOROSCOPY TIME:  2 minutes 56 seconds; 7 acquired images FINDINGS: Frontal and lateral views obtained. There is a compression fracture of the T12 vertebral body with severe collapse of this vertebral body. Subsequent images show balloon placement within the leftward and rightward aspects of the T12 vertebral body with subsequent contrast administration for kyphoplasty procedure. No appreciable extravasation of contrast material noted. Note that there is slight anterior wedging of the T11 vertebral body as well. IMPRESSION: Kyphoplasty procedure at T12 without appreciable contrast extravasation. Note that there is significant compression of the T12 vertebral body with modest anterior wedging at T11. Electronically Signed   By: Bretta Bang III M.D.   On: 08/14/2017 14:27    Disposition: 06-Home-Health Care Svc    Follow-up Information    Kennedy Bucker, MD Follow up.   Specialty:  Orthopedic Surgery Why:  as scheduled Contact information: [address] [facility] [place] [address] [phone]            Signed: Amador Cunas [facility] 09/08/2017, 8:54 AM

## 2017-09-08 NOTE — Progress Notes (Signed)
Primary nurse rechecked Pt BP once again after [place]. Pt BP had dropped once more. Primary nurse rechecked manually and got 86/52. Pt is somewhat Lethargic but arousable. Following commands. Primary nurse notified Dr. Signa KellSunny Patel. Orders received to hold off on all narcotics until pt is more arousable.

## 2017-09-08 NOTE — Progress Notes (Addendum)
Patient VS stable. Patient/family concerned about pain control after discharge, was instructed to follow up with PCP. Patient states pain is a 2/10 at this time and denies pain exacerbation.  Traci SeaKimberly Keron Koffman, SN [facility]

## 2017-09-08 NOTE — Progress Notes (Signed)
   Subjective: 1 Day Post-Op Procedure(s) (LRB): KYPHOPLASTY-L1 (N/A) Patient reports pain as 0/10. Has not been out of bed. Pain severe last night, improved this am. Patient is resting well. No pain while lying in bed. Denies any CP, SOB, ABD pain. We will start therapy today.  Plan is to go Home after hospital stay.  Objective: Vital signs in last 24 hours: Temp:  [97.6 F (36.4 C)-102.1 F (38.9 C)] 97.6 F (36.4 C) (03/02 0733) Pulse Rate:  [53-118] 75 (03/02 0733) Resp:  [12-23] 16 (03/02 0733) BP: (86-136)/(47-87) 88/47 (03/02 0733) SpO2:  [88 %-100 %] 100 % (03/02 0733) Weight:  [66.2 kg (146 lb)] 66.2 kg (146 lb) (03/01 1223)  Intake/Output from previous day: 03/01 0701 - 03/02 0700 In: 1507.5 [I.V.:1507.5] Out: 2 [Blood:2] Intake/Output this shift: No intake/output data recorded.  No results for input(s): HGB in the last 72 hours. No results for input(s): WBC, RBC, HCT, PLT in the last 72 hours. No results for input(s): NA, K, CL, CO2, BUN, CREATININE, GLUCOSE, CALCIUM in the last 72 hours. No results for input(s): LABPT, INR in the last 72 hours.  EXAM General - Patient is Alert, Appropriate and Oriented  Spine - No spinous process tenderness lumbar or thoracic spine. No paravertebral muscle tenderness on exam. Patient able to sit up in bed with limited assistance, only mild pain.  Extremity - Sensation intact distally Intact pulses distally Dorsiflexion/Plantar flexion intact  Dressing - dressing C/D/I and no drainage.  Motor Function - intact, moving foot and toes well on exam.   Past Medical History:  Diagnosis Date  . Arthritis   . GERD (gastroesophageal reflux disease)   . History of hiatal hernia   . Hypertension   . Thyroid disease     Assessment/Plan:   1 Day Post-Op Procedure(s) (LRB): KYPHOPLASTY-L1 (N/A) Active Problems:   Closed compression fracture of L1 lumbar vertebra (HCC)  Estimated body mass index is 26.7 kg/m as calculated from  the following:   Height as of this encounter: 5\' 2"  (1.575 m).   Weight as of this encounter: 66.2 kg (146 lb).  PT to evaluate today.  Plan on discharge to home today if pain controlled and able to safely ambulate  Follow up with [facility] in 2 weeks for repeat evaluation and xrays    T. Cranston Neighborhris Gaines, PA-C [facility] 09/08/2017, 8:47 AM

## 2017-09-08 NOTE — Care Management Note (Signed)
Case Management Note  Patient Details  Name: Traci Lee MRN: [id number] Date of Birth: [birth year]  Subjective/Objective:    [facility] for HH=PT, [facility]                 Action/Plan:   Expected Discharge Date:  09/08/17               Expected Discharge Plan:     In-House Referral:     Discharge planning Services     Post Acute Care Choice:    Choice offered to:     DME Arranged:    DME Agency:     HH Arranged:    HH Agency:     Status of Service:     If discussed at [employer] Meetings, dates discussed:    Additional Comments:  Anuhea Gassner A, RN 09/08/2017, 3:45 PM

## 2017-09-08 NOTE — Progress Notes (Signed)
Discharge instructions and first dose instructions given to patient and family (2 sons). Patient and family denies questions and were instructed to call Monday for follow up appointments. VS stable. Patient ambulated without supplemental Oxygen around the nurses station O2 sats. Remained above 95%. Patient denied dizziness. Patient discharged per MD order.  Leroy SeaKimberly Itzabella Sorrels, SN [facility]

## 2017-09-08 NOTE — Progress Notes (Signed)
Pt BP low at 86/53. Primary nurse paged and spoke to Dr. Signa KellSunny Patel. Orders received for 500 ml bolus of NS. Primary nurse to continue to monitor.

## 2017-09-08 NOTE — Care Management Note (Signed)
Case Management Note  Patient Details  Name: Traci Lee MRN: [id number] Date of Birth: March 15, 1934  Subjective/Objective:   [facility]-PT is recommending home health PT. Dr Signa KellSunny Patel is on call for Traci Lee. This Clinical research associatewriter called Dr Allena KatzPatel who reports that he does not know how to enter home health orders into Epic. This Clinical research associatewriter explained that the nurses cannot enter the Face-to-Face and suggested that he ask one of his fellow physicians to show him how to enter home health orders.                  Action/Plan:   Expected Discharge Date:  09/08/17               Expected Discharge Plan:     In-House Referral:     Discharge planning Services     Post Acute Care Choice:    Choice offered to:     DME Arranged:    DME Agency:     HH Arranged:    HH Agency:     Status of Service:     If discussed at [employer] Meetings, dates discussed:    Additional Comments:  Dainel Arcidiacono A, RN 09/08/2017, 3:28 PM

## 2017-09-08 NOTE — Care Management Note (Signed)
Case Management Note  Patient Details  Name: Traci Lee MRN: [id number] Date of Birth: [birth year]  Subjective/Objective:    [facility] a [facility]=PT order.                 Action/Plan:   Expected Discharge Date:  09/08/17               Expected Discharge Plan:     In-House Referral:     Discharge planning Services     Post Acute Care Choice:    Choice offered to:     DME Arranged:    DME Agency:     HH Arranged:    HH Agency:     Status of Service:     If discussed at [employer] Meetings, dates discussed:    Additional Comments:  Dodger Sinning A, RN 09/08/2017, 3:40 PM

## 2017-09-10 ENCOUNTER — Encounter: Payer: Self-pay | Admitting: Orthopedic Surgery

## 2017-09-11 LAB — SURGICAL PATHOLOGY

## 2017-10-23 ENCOUNTER — Emergency Department: Payer: Medicare Other

## 2017-10-23 ENCOUNTER — Emergency Department
Admission: EM | Admit: 2017-10-23 | Discharge: 2017-10-23 | Disposition: A | Discharge status: Other Acute Inpatient Hospital | Payer: Medicare Other | Attending: Emergency Medicine | Admitting: Emergency Medicine

## 2017-10-23 ENCOUNTER — Encounter: Payer: Self-pay | Admitting: Emergency Medicine

## 2017-10-23 ENCOUNTER — Other Ambulatory Visit: Payer: Self-pay

## 2017-10-23 DIAGNOSIS — S065X0A Traumatic subdural hemorrhage without loss of consciousness, initial encounter: Secondary | ICD-10-CM | POA: Insufficient documentation

## 2017-10-23 DIAGNOSIS — S42032A Displaced fracture of lateral end of left clavicle, initial encounter for closed fracture: Secondary | ICD-10-CM | POA: Diagnosis not present

## 2017-10-23 DIAGNOSIS — Z79899 Other long term (current) drug therapy: Secondary | ICD-10-CM | POA: Diagnosis not present

## 2017-10-23 DIAGNOSIS — Y9389 Activity, other specified: Secondary | ICD-10-CM | POA: Insufficient documentation

## 2017-10-23 DIAGNOSIS — I10 Essential (primary) hypertension: Secondary | ICD-10-CM | POA: Insufficient documentation

## 2017-10-23 DIAGNOSIS — W108XXA Fall (on) (from) other stairs and steps, initial encounter: Secondary | ICD-10-CM | POA: Insufficient documentation

## 2017-10-23 DIAGNOSIS — Z23 Encounter for immunization: Secondary | ICD-10-CM | POA: Insufficient documentation

## 2017-10-23 DIAGNOSIS — W19XXXA Unspecified fall, initial encounter: Secondary | ICD-10-CM

## 2017-10-23 DIAGNOSIS — Y92008 Other place in unspecified non-institutional (private) residence as the place of occurrence of the external cause: Secondary | ICD-10-CM | POA: Insufficient documentation

## 2017-10-23 DIAGNOSIS — Y999 Unspecified external cause status: Secondary | ICD-10-CM | POA: Diagnosis not present

## 2017-10-23 DIAGNOSIS — S0101XA Laceration without foreign body of scalp, initial encounter: Secondary | ICD-10-CM | POA: Insufficient documentation

## 2017-10-23 DIAGNOSIS — S065X9A Traumatic subdural hemorrhage with loss of consciousness of unspecified duration, initial encounter: Secondary | ICD-10-CM

## 2017-10-23 DIAGNOSIS — Z7982 Long term (current) use of aspirin: Secondary | ICD-10-CM | POA: Diagnosis not present

## 2017-10-23 DIAGNOSIS — S065XAA Traumatic subdural hemorrhage with loss of consciousness status unknown, initial encounter: Secondary | ICD-10-CM

## 2017-10-23 DIAGNOSIS — S098XXA Other specified injuries of head, initial encounter: Secondary | ICD-10-CM | POA: Diagnosis present

## 2017-10-23 LAB — BASIC METABOLIC PANEL
ANION GAP: 4 — AB (ref 5–15)
BUN: 23 mg/dL — AB (ref 6–20)
CHLORIDE: 103 mmol/L (ref 101–111)
CO2: 30 mmol/L (ref 22–32)
Calcium: 8.9 mg/dL (ref 8.9–10.3)
Creatinine, Ser: 0.86 mg/dL (ref 0.44–1.00)
GFR calc Af Amer: >60 mL/min (ref >60)
GLUCOSE: 132 mg/dL — AB (ref 65–99)
POTASSIUM: 4 mmol/L (ref 3.5–5.1)
Sodium: 137 mmol/L (ref 135–145)

## 2017-10-23 LAB — TROPONIN I: Troponin I: <0.03 ng/mL (ref <0.03)

## 2017-10-23 LAB — CBC
HCT: 42.1 % (ref 35.0–47.0)
Hemoglobin: 14.2 g/dL (ref 12.0–16.0)
MCH: 32.1 pg (ref 26.0–34.0)
MCHC: 33.8 g/dL (ref 32.0–36.0)
MCV: 94.9 fL (ref 80.0–100.0)
PLATELETS: 258 10*3/uL (ref 150–440)
RBC: 4.44 MIL/uL (ref 3.80–5.20)
RDW: 14.8 % — ABNORMAL HIGH (ref 11.5–14.5)
WBC: 4.4 10*3/uL (ref 3.6–11.0)

## 2017-10-23 LAB — PROTIME-INR
INR: 0.92
Prothrombin Time: 12.3 seconds (ref 11.4–15.2)

## 2017-10-23 MED ORDER — LIDOCAINE-EPINEPHRINE (PF) 2 %-1:200000 IJ SOLN
20.0000 mL | Freq: Once | INTRAMUSCULAR | Status: AC
  Administered 2017-10-23: 20 mL
  Filled 2017-10-23: qty 20

## 2017-10-23 MED ORDER — TETANUS-DIPHTH-ACELL PERTUSSIS 5-2.5-18.5 LF-MCG/0.5 IM SUSP
0.5000 mL | Freq: Once | INTRAMUSCULAR | Status: AC
  Administered 2017-10-23: 0.5 mL via INTRAMUSCULAR
  Filled 2017-10-23: qty 0.5

## 2017-10-23 NOTE — ED Triage Notes (Signed)
Pt to ED via EMS from home c/o fall at home was checking mail and fell forward off of porch onto concrete, c/o pain to left shoulder and laceration to back of head, head wrapped by fire department.  Denies LOC but unsure why she fell, presents A&Ox4, speaking in complete and coherent sentences, PERRLA, able to move all four extremities, takes 81mg  ASA daily and took today.

## 2017-10-23 NOTE — ED Provider Notes (Addendum)
[facility] Emergency Department Provider Note  ____________________________________________   I have reviewed the triage vital signs and the nursing notes. Where available I have reviewed prior notes and, if possible and indicated, outside hospital notes.    HISTORY  Chief Complaint Fall    HPI Traci Lee is a 84 y.o. female presents today complaining of a fall.  She feels very strongly she just missed her footing.  She did not pass out she remembers the fall and she members when her head hit.  She states that it hurt.  She was walking up to the porch and lost her footing.  Patient did not have any antecedent or subsequent chest pain shortness of breath nausea or vomiting.  She complains of some pain to the left shoulder and she bumped her head.  She Has some evidence of laceration   Past Medical History:  Diagnosis Date  . Arthritis   . GERD (gastroesophageal reflux disease)   . History of hiatal hernia   . Hypertension   . Thyroid disease     Patient Active Problem List   Diagnosis Date Noted  . Closed compression fracture of L1 lumbar vertebra 09/07/2017  . Low back pain 08/14/2017    Past Surgical History:  Procedure Laterality Date  . ABDOMINAL HYSTERECTOMY    . APPENDECTOMY    . KYPHOPLASTY N/A 08/14/2017   Procedure: KYPHOPLASTY;  Surgeon: Kennedy Bucker, MD;  Location: [facility] ORS;  Service: Orthopedics;  Laterality: N/A;  . KYPHOPLASTY N/A 09/07/2017   Procedure: [id number];  Surgeon: Kennedy Bucker, MD;  Location: [facility] ORS;  Service: Orthopedics;  Laterality: N/A;  . TONSILLECTOMY    . TUBAL LIGATION    . VERTEBROPLASTY N/A 07/26/2017   Procedure: VERTEBROPLASTY/ SACROPLASTY S1;  Surgeon: Kennedy Bucker, MD;  Location: [facility] ORS;  Service: Orthopedics;  Laterality: N/A;    Prior to Admission medications   Medication Sig Start Date End Date Taking? Authorizing Provider  aspirin EC 81 MG tablet Take 81 mg by mouth daily.     [provider]  azithromycin (ZITHROMAX Z-PAK) 250 MG tablet Take 2 tablets (500 mg) on  Day 1,  followed by 1 tablet (250 mg) once daily on Days 2 through 5. 09/08/17   Evon Slack, PA-C  Calcium Carb-Cholecalciferol (CALCIUM+D3 PO) Take 1 tablet by mouth 2 (two) times daily.    [provider]  cholecalciferol (VITAMIN D) 1000 units tablet Take 1,000 Units by mouth daily.    [provider]  cyclobenzaprine (FLEXERIL) 10 MG tablet Take 10 mg by mouth 3 (three) times daily as needed for muscle spasms.    [provider]  Dextran 70-Hypromellose (NATURAL BALANCE TEARS) 0.1-0.3 % SOLN Place 1-2 drops into both eyes 2 (two) times daily as needed (dry eyes).     [provider]  enalapril (VASOTEC) 20 MG tablet Take 20 mg by mouth daily.    [provider]  HYDROcodone-acetaminophen (NORCO/VICODIN) 5-325 MG tablet Take 1-2 tablets by mouth every 4 (four) hours as needed for moderate pain. 09/08/17   Evon Slack, PA-C  levothyroxine (SYNTHROID, LEVOTHROID) 88 MCG tablet Take 88 mcg by mouth daily before breakfast.    [provider]  Multiple Vitamin (MULTIVITAMIN WITH MINERALS) TABS tablet Take 1 tablet by mouth daily. Centrum Silver for Women    [provider]  omeprazole (PRILOSEC) 20 MG capsule Take 20 mg by mouth daily before breakfast.     [provider]  oxybutynin (DITROPAN)  5 MG tablet Take 5 mg by mouth 2 (two) times daily.     [provider]  polyethylene glycol (MIRALAX / GLYCOLAX) packet Take 17 g by mouth daily. For constipation 07/16/17   [provider]    Allergies Alendronate and Penicillins  Family History  Problem Relation Age of Onset  . Breast cancer Neg Hx     Social History Social History   Tobacco Use  . Smoking status: Never Smoker  . Smokeless tobacco: Never Used  Substance Use Topics  . Alcohol use: No  . Drug use: No    Review of  Systems Constitutional: No fever/chills Eyes: No visual changes. ENT: No sore throat. No stiff neck no neck pain Cardiovascular: Denies chest pain. Respiratory: Denies shortness of breath. Gastrointestinal:   no vomiting.  No diarrhea.  No constipation. Genitourinary: Negative for dysuria. Musculoskeletal: Negative lower extremity swelling Skin: Negative for rash. Neurological: Negative for severe headaches, focal weakness or numbness.   ____________________________________________   PHYSICAL EXAM:  VITAL SIGNS: ED Triage Vitals  Enc Vitals Group     BP 10/23/17 1754 (!) 148/86     Pulse Rate 10/23/17 1754 86     Resp 10/23/17 1754 16     Temp 10/23/17 1754 97.9 F (36.6 C)     Temp Source 10/23/17 1754 Oral     SpO2 10/23/17 1754 98 %     Weight 10/23/17 1755 145 lb (65.8 kg)     Height --      Head Circumference --      Peak Flow --      Pain Score 10/23/17 1755 0     Pain Loc --      Pain Edu? --      Excl. in GC? --     Constitutional: Alert and oriented. Well appearing and in no acute distress. Eyes: Conjunctivae are normal Head: There is matted blood in her hair I do not see the lack yet no depressed skull fracture noted we will clean it out HEENT: No congestion/rhinnorhea. Mucous membranes are moist.  Oropharynx non-erythematous Neck:   Nontender with no meningismus, no masses, no stridor Cardiovascular: Normal rate, regular rhythm. Grossly normal heart sounds.  Good peripheral circulation. Respiratory: Normal respiratory effort.  No retractions. Lungs CTAB. Abdominal: Soft and nontender. No distention. No guarding no rebound Back:  There is no focal tenderness or step off.  there is no midline tenderness there are no lesions noted. there is no CVA tenderness Musculoskeletal: No lower extremity tenderness, is to palpation of the left [facility] joint with no deformity noted of any significance, full range of motion of the shoulder with no tenderness. No joint effusions,  no DVT signs strong distal pulses no edema Neurologic:  Normal speech and language. No gross focal neurologic deficits are appreciated.  Skin:  Skin is warm, dry and intact. No rash noted. Psychiatric: Mood and affect are normal. Speech and behavior are normal.  ____________________________________________   LABS (all labs ordered are listed, but only abnormal results are displayed)  Labs Reviewed  CBC - Abnormal; Notable for the following components:      Result Value   RDW 14.8 (*)    All other components within normal limits  BASIC METABOLIC PANEL - Abnormal; Notable for the following components:   Glucose, Bld 132 (*)    BUN 23 (*)    Anion gap 4 (*)    All other components within normal limits  TROPONIN I  URINALYSIS, COMPLETE (  UACMP) WITH MICROSCOPIC    Pertinent labs  results that were available during my care of the patient were reviewed by me and considered in my medical decision making (see chart for details). ____________________________________________  EKG  I personally interpreted any EKGs ordered by me or triage Sinus rhythm, rate 70 bpm no acute ST elevation or depression, ____________________________________________  RADIOLOGY  Pertinent labs & imaging results that were available during my care of the patient were reviewed by me and considered in my medical decision making (see chart for details). If possible, patient and/or family made aware of any abnormal findings.  No results found. ____________________________________________    PROCEDURES  Procedure(s) performed: None  .Marland KitchenLaceration Repair Date/Time: 10/23/2017 8:20 PM Performed by: Jeanmarie Plant, MD Authorized by: Jeanmarie Plant, MD   Consent:    Consent obtained:  Verbal   Consent given by:  Patient   Risks discussed:  Infection, pain, retained foreign body, need for additional repair and poor wound healing   Alternatives discussed:  No treatment Anesthesia (see MAR for exact  dosages):    Anesthesia method:  Local infiltration   Local anesthetic:  Lidocaine 2% WITH epi Laceration details:    Location:  Scalp   Scalp location:  L temporal   Length (cm):  3.5   Depth (mm):  3 Repair type:    Repair type:  Intermediate Pre-procedure details:    Preparation:  Patient was prepped and draped in usual sterile fashion Exploration:    Hemostasis achieved with:  Epinephrine   Wound exploration: entire depth of wound probed and visualized     Contaminated: no   Treatment:    Area cleansed with:  Betadine and saline   Amount of cleaning:  Standard   Irrigation solution:  Sterile saline   Irrigation method:  Syringe   Visualized foreign bodies/material removed: no   Skin repair:    Repair method:  Sutures   Suture size:  3-0   Suture material:  Prolene   Suture technique:  Simple interrupted   Number of sutures:  9 Approximation:    Approximation:  Close Post-procedure details:    Dressing:  Non-adherent dressing   Patient tolerance of procedure:  Tolerated well, no immediate complications    Critical Care performed: CRITICAL CARE Performed by: Jeanmarie Plant   Total critical care time: 35 minutes  Critical care time was exclusive of separately billable procedures and treating other patients.  Critical care was necessary to treat or prevent imminent or life-threatening deterioration.  Critical care was time spent personally by me on the following activities: development of treatment plan with patient and/or surrogate as well as nursing, discussions with consultants, evaluation of patient's response to treatment, examination of patient, obtaining history from patient or surrogate, ordering and performing treatments and interventions, ordering and review of laboratory studies, ordering and review of radiographic studies, pulse oximetry and re-evaluation of patient's condition.   ____________________________________________   INITIAL IMPRESSION /  ASSESSMENT AND PLAN / ED COURSE  Pertinent labs & imaging results that were available during my care of the patient were reviewed by me and considered in my medical decision making (see chart for details).  Here after losing her footing on her stairs, we did do basic blood work which is reassuring, we will image her head and her shoulder and if that is negative we can hopefully get her home.  She has no ongoing complaints at this time.  There is a laceration which we will clean  out and repair as needed.  Imaging is pending.   ----------------------------------------- 7:09 PM on 10/23/2017 -----------------------------------------  Mains in no acute distress neurologically intact, GCS 15, her sons feel that she is at her neurologic baseline, I was called by radiology however because she has a very small bilateral subdurals.  This facility is not usually quick to handle this on someone on aspirin.  I will call [facility] and see if they have room for her or other suggestions.  We are cleaning up her head wound and as a precaution I will get a CT of her C-spine although patient has absolutelyno tenderness and I think is awake and alert enough to tell me if she does  ----------------------------------------- 8:22 PM on 10/23/2017 -----------------------------------------  [facility] who [facility] with management and will accept in transfer    ____________________________________________   FINAL CLINICAL IMPRESSION(S) / ED DIAGNOSES  Final diagnoses:  None      This chart was dictated using voice recognition software.  Despite best efforts to proofread,  errors can occur which can change meaning.      Jeanmarie PlantMcShane, James A, MD 10/23/17 1853    Jeanmarie PlantMcShane, James A, MD 10/23/17 1910    Jeanmarie PlantMcShane, James A, MD 10/23/17 2021    Jeanmarie PlantMcShane, James A, MD 10/23/17 2022

## 2017-10-23 NOTE — ED Notes (Signed)
Patient transported to CT 

## 2017-10-23 NOTE — ED Notes (Signed)
Posterior head cleaned and laceration identified.  Laceration cart setup in room.

## 2017-10-23 NOTE — ED Notes (Signed)
EDP at bedside  

## 2018-09-14 IMAGING — XA DG LUMBAR SPINE 2-3V
2 series · 2 of 2 positions shown · non-contrast
Comparison: Lumbar spine MRI 07/13/2017

CLINICAL DATA: Sacroplasty.

EXAM:
LUMBAR SPINE - 2-3 VIEW

[Series 1: cont. · 1 of 1 slices shown]
[im 1/1]
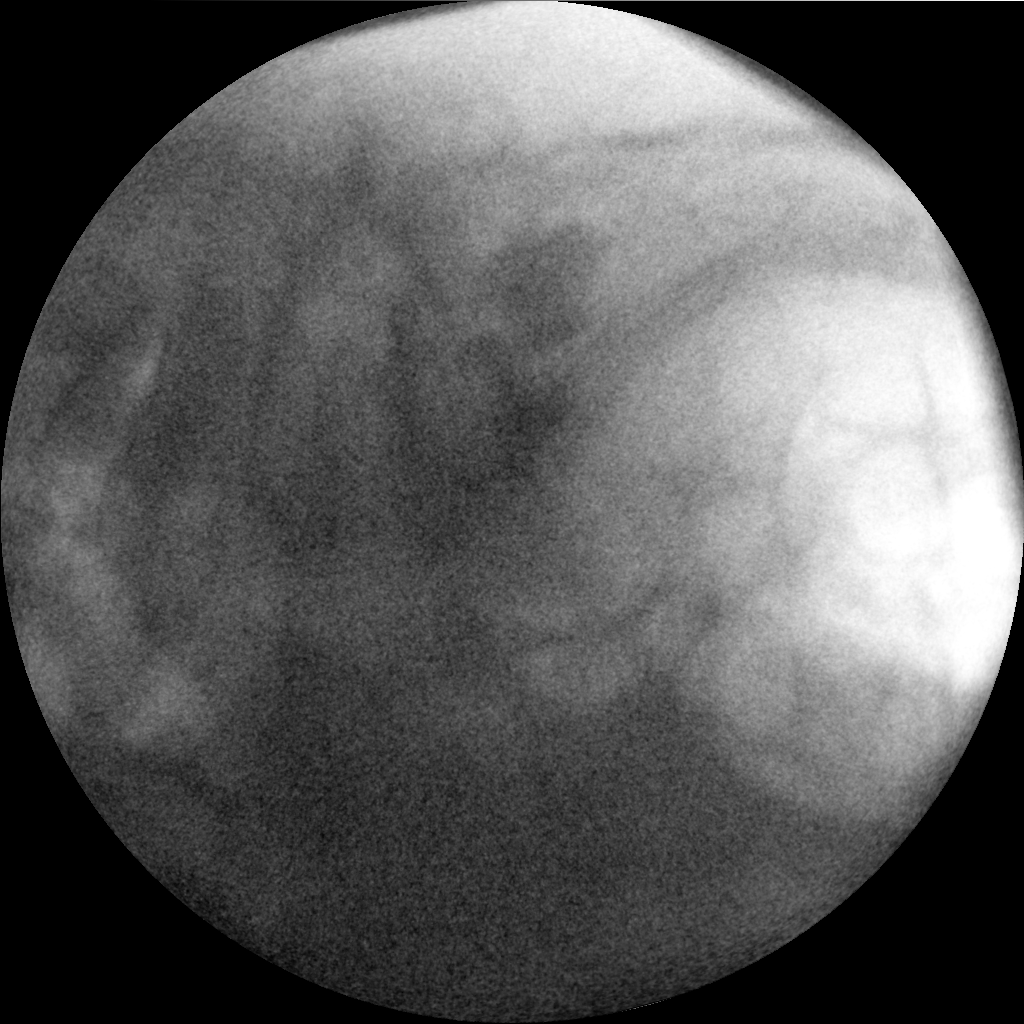

[Series 1: ortho standard · 1 of 1 slices shown]
[im 1/1]
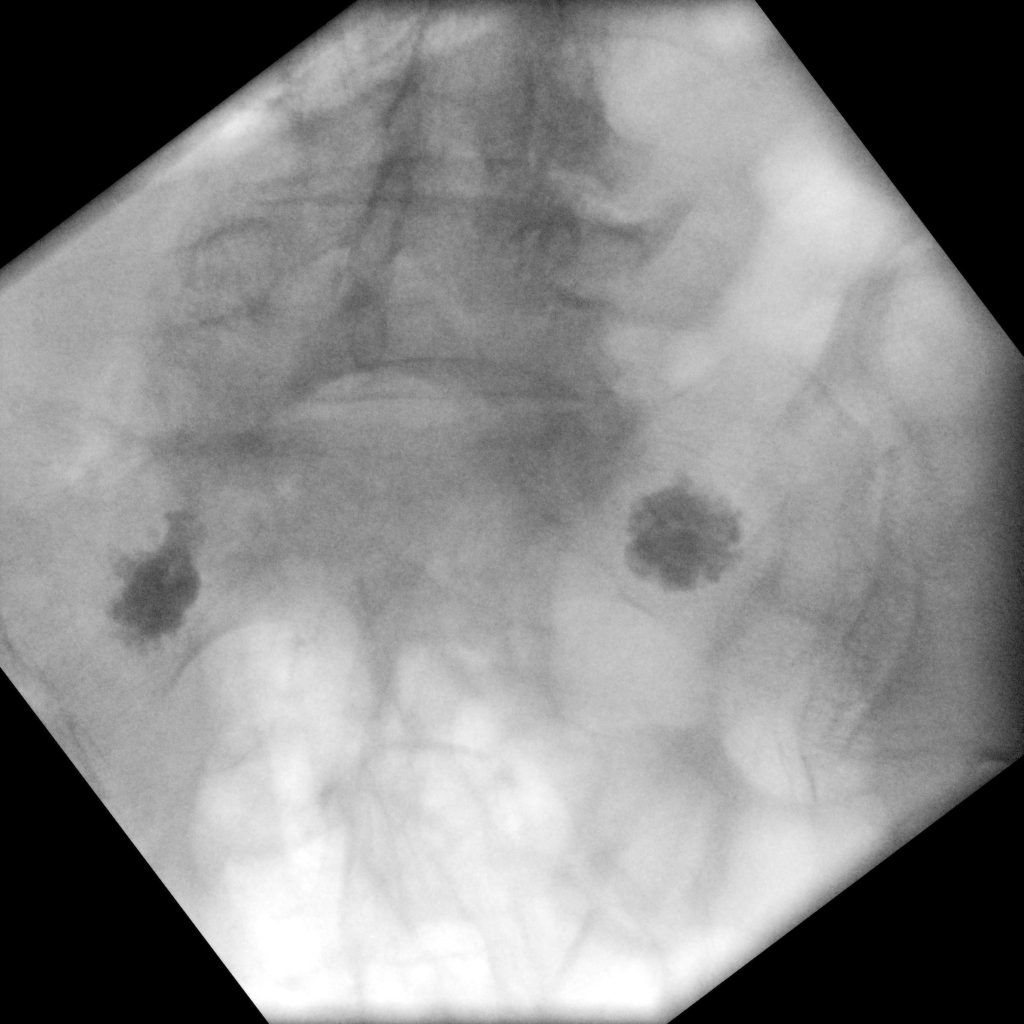

[2 of 2 positions shown; findings below may reference images not displayed]

FLUOROSCOPY TIME:  C-arm fluoroscopic images were obtained
intraoperatively and submitted for post operative interpretation.
Please see the performing provider's procedural report for the
fluoroscopy time utilized.
FINDINGS: Two intraoperative spot fluoroscopic images centered at the
lumbosacral junction are provided. Bone cement is present in the
sacral ala bilaterally.
IMPRESSION: Intraoperative images during sacroplasty.
